# Patient Record
Sex: Female | Born: 1995 | Race: White | Hispanic: No | Marital: Single | Smoking: Never smoker
Health system: Southern US, Community
[De-identification: ages and names within clinical notes are randomized; demographics above are authoritative.]

---

## 2020-11-09 ENCOUNTER — Emergency Department: Payer: Self-pay

## 2020-11-09 ENCOUNTER — Encounter: Payer: Self-pay | Admitting: Emergency Medicine

## 2020-11-09 ENCOUNTER — Other Ambulatory Visit: Payer: Self-pay

## 2020-11-09 ENCOUNTER — Emergency Department
Admission: EM | Admit: 2020-11-09 | Discharge: 2020-11-09 | Disposition: A | Discharge status: Home / Self Care | Payer: Self-pay | Attending: Student in an Organized Health Care Education/Training Program | Admitting: Student in an Organized Health Care Education/Training Program

## 2020-11-09 DIAGNOSIS — N83201 Unspecified ovarian cyst, right side: Secondary | ICD-10-CM | POA: Insufficient documentation

## 2020-11-09 DIAGNOSIS — N760 Acute vaginitis: Secondary | ICD-10-CM | POA: Insufficient documentation

## 2020-11-09 DIAGNOSIS — B9689 Other specified bacterial agents as the cause of diseases classified elsewhere: Secondary | ICD-10-CM | POA: Insufficient documentation

## 2020-11-09 DIAGNOSIS — R310 Gross hematuria: Secondary | ICD-10-CM | POA: Insufficient documentation

## 2020-11-09 LAB — WET PREP, GENITAL
Sperm: NONE SEEN
Trich, Wet Prep: NONE SEEN
Yeast Wet Prep HPF POC: NONE SEEN

## 2020-11-09 LAB — CBC
HCT: 39.6 % (ref 36.0–46.0)
Hemoglobin: 12.5 g/dL (ref 12.0–15.0)
MCH: 24 pg — ABNORMAL LOW (ref 26.0–34.0)
MCHC: 31.6 g/dL (ref 30.0–36.0)
MCV: 76.2 fL — ABNORMAL LOW (ref 80.0–100.0)
Platelets: 322 10*3/uL (ref 150–400)
RBC: 5.2 MIL/uL — ABNORMAL HIGH (ref 3.87–5.11)
RDW: 17.7 % — ABNORMAL HIGH (ref 11.5–15.5)
WBC: 11.4 10*3/uL — ABNORMAL HIGH (ref 4.0–10.5)
nRBC: 0 % (ref 0.0–0.2)

## 2020-11-09 LAB — COMPREHENSIVE METABOLIC PANEL
ALT: 18 U/L (ref 0–44)
AST: 21 U/L (ref 15–41)
Albumin: 4 g/dL (ref 3.5–5.0)
Alkaline Phosphatase: 80 U/L (ref 38–126)
Anion gap: 6 (ref 5–15)
BUN: 13 mg/dL (ref 6–20)
CO2: 24 mmol/L (ref 22–32)
Calcium: 8.9 mg/dL (ref 8.9–10.3)
Chloride: 106 mmol/L (ref 98–111)
Creatinine, Ser: 0.39 mg/dL — ABNORMAL LOW (ref 0.44–1.00)
GFR, Estimated: >60 mL/min (ref >60)
Glucose, Bld: 94 mg/dL (ref 70–99)
Potassium: 4.2 mmol/L (ref 3.5–5.1)
Sodium: 136 mmol/L (ref 135–145)
Total Bilirubin: 0.6 mg/dL (ref 0.3–1.2)
Total Protein: 7.7 g/dL (ref 6.5–8.1)

## 2020-11-09 LAB — CHLAMYDIA/NGC RT PCR (ARMC ONLY)
Chlamydia Tr: NOT DETECTED
N gonorrhoeae: NOT DETECTED

## 2020-11-09 LAB — URINALYSIS, COMPLETE (UACMP) WITH MICROSCOPIC
Bilirubin Urine: NEGATIVE
Glucose, UA: NEGATIVE mg/dL
Ketones, ur: NEGATIVE mg/dL
Nitrite: NEGATIVE
Protein, ur: NEGATIVE mg/dL
RBC / HPF: >50 RBC/hpf — ABNORMAL HIGH (ref 0–5)
Specific Gravity, Urine: 1.017 (ref 1.005–1.030)
WBC, UA: >50 WBC/hpf — ABNORMAL HIGH (ref 0–5)
pH: 9 — ABNORMAL HIGH (ref 5.0–8.0)

## 2020-11-09 LAB — LIPASE, BLOOD: Lipase: 30 U/L (ref 11–51)

## 2020-11-09 LAB — PREGNANCY, URINE: Preg Test, Ur: NEGATIVE

## 2020-11-09 MED ORDER — METRONIDAZOLE 500 MG PO TABS
500.0000 mg | ORAL_TABLET | Freq: Once | ORAL | Status: AC
  Administered 2020-11-09: 500 mg via ORAL
  Filled 2020-11-09: qty 1

## 2020-11-09 MED ORDER — METRONIDAZOLE 500 MG PO TABS: 500.0000 mg | ORAL_TABLET | Freq: Two times a day (BID) | ORAL | 0 refills | Status: AC

## 2020-11-09 NOTE — ED Triage Notes (Signed)
Pt comes into the ED via POV c/o lower abdominal pain and blood in her urine.  PT states this has been ongoing x 2 weeks.  PT  concerned she may have a UTI.  Pt in NAD at this time.

## 2020-11-09 NOTE — ED Notes (Signed)
See triage note  Presents with urinary freq and pain to lower abd   With some nausea denies any vomiting

## 2020-11-09 NOTE — ED Provider Notes (Signed)
[facility] Emergency Department Provider Note  ____________________________________________   Event Date/Time   First MD Initiated Contact with Patient 11/09/20 1539     (approximate)  I have reviewed the triage vital signs and the nursing notes.   HISTORY  Chief Complaint Abdominal Pain  HPI Mallory Herrera is a 25 y.o. female presents to the ED with complaints of lower abdominal pain flank pain, and some gross hematuria.  She reports symptoms of the last 2 weeks.  Patient relocated to the area over the last 2 weeks, and has moved in with her boyfriend.  She also notes some vulvar pain with intercourse, over the last 2 weeks.  Patient denies any interim fever, chills, sweats, chest pain, shortness of breath patient denies a history of kidney stones, and denies any urinary retention.  History reviewed. No pertinent past medical history.  There are no problems to display for this patient.   History reviewed. No pertinent surgical history.  Prior to Admission medications   Medication Sig Start Date End Date Taking? Authorizing Provider  metroNIDAZOLE (FLAGYL) 500 MG tablet Take 1 tablet (500 mg total) by mouth 2 (two) times daily for 7 days. 11/09/20 11/16/20 Yes Coral Timme, Charlesetta Ivory, PA-C    Allergies Patient has no known allergies.  History reviewed. No pertinent family history.  Social History Social History   Tobacco Use   Smoking status: Never   Smokeless tobacco: Never  Substance Use Topics   Alcohol use: Not Currently   Drug use: Yes    Types: Marijuana    Review of Systems  Constitutional: No fever/chills Eyes: No visual changes. ENT: No sore throat. Cardiovascular: Denies chest pain. Respiratory: Denies shortness of breath. Gastrointestinal: Reports lower abdominal and flank pain.  No nausea, no vomiting.  No diarrhea.  No constipation. Genitourinary: Negative for dysuria.  Reports hematuria as above. Musculoskeletal: Negative  for back pain. Skin: Negative for rash. Neurological: Negative for headaches, focal weakness or numbness. ____________________________________________   PHYSICAL EXAM:  VITAL SIGNS: ED Triage Vitals [11/09/20 1300]  Enc Vitals Group     BP 129/90     Pulse Rate 86     Resp 18     Temp 98.3 F (36.8 C)     Temp Source Oral     SpO2 97 %     Weight 240 lb (108.9 kg)     Height 5\' 8"  (1.727 m)     Head Circumference      Peak Flow      Pain Score 6     Pain Loc      Pain Edu?      Excl. in GC?     Constitutional: Alert and oriented. Well appearing and in no acute distress. Head: Atraumatic. Cardiovascular: Normal rate, regular rhythm. Grossly normal heart sounds.  Good peripheral circulation. Respiratory: Normal respiratory effort.  No retractions. Lungs CTAB. Gastrointestinal: Soft and nontender. No distention. No abdominal bruits. Mild CVA tenderness. GU: Normal external genitalia.  Patient with some mild vulvar mucosal irritation to the right side of the introitus.  There is a copious amount of thick white discharge in the vault.  Patient without any significant CMT or adnexal masses appreciated.  Manual exam is however, uncomfortable for the patient secondary to the vaginal introitus. Musculoskeletal: No lower extremity tenderness nor edema.  No joint effusions. Neurologic:  Normal speech and language. No gross focal neurologic deficits are appreciated. No gait instability. Skin:  Skin is warm, dry and intact.  No rash noted. Psychiatric: Mood and affect are normal. Speech and behavior are normal. ____________________________________________   LABS (all labs ordered are listed, but only abnormal results are displayed)  Labs Reviewed  WET PREP, GENITAL - Abnormal; Notable for the following components:      Result Value   Clue Cells Wet Prep HPF POC PRESENT (*)    WBC, Wet Prep HPF POC MANY (*)    All other components within normal limits  COMPREHENSIVE METABOLIC PANEL  - Abnormal; Notable for the following components:   Creatinine, Ser 0.39 (*)    All other components within normal limits  CBC - Abnormal; Notable for the following components:   WBC 11.4 (*)    RBC 5.20 (*)    MCV 76.2 (*)    MCH 24.0 (*)    RDW 17.7 (*)    All other components within normal limits  URINALYSIS, COMPLETE (UACMP) WITH MICROSCOPIC - Abnormal; Notable for the following components:   Color, Urine STRAW (*)    APPearance HAZY (*)    pH 9.0 (*)    Hgb urine dipstick MODERATE (*)    Leukocytes,Ua SMALL (*)    RBC / HPF >50 (*)    WBC, UA >50 (*)    Bacteria, UA RARE (*)    All other components within normal limits  CHLAMYDIA/NGC RT PCR ([facility] ONLY)            URINE CULTURE  LIPASE, BLOOD  PREGNANCY, URINE   ____________________________________________  EKG   ____________________________________________  RADIOLOGY I, Lissa Hoard, personally viewed and evaluated these images (plain radiographs) as part of my medical decision making, as well as reviewing the written report by the radiologist.  ED MD interpretation:  agree with reports  Official radiology report(s): US Pelvis Complete  Result Date: 11/09/2020 CLINICAL DATA:  Lower abdominal pain for 10 days. EXAM: TRANSABDOMINAL AND TRANSVAGINAL ULTRASOUND OF PELVIS TECHNIQUE: Both transabdominal and transvaginal ultrasound examinations of the pelvis were performed. Transabdominal technique was performed for global imaging of the pelvis including uterus, ovaries, adnexal regions, and pelvic cul-de-sac. It was necessary to proceed with endovaginal exam following the transabdominal exam to visualize the uterus, endometrium and ovaries to better advantage. COMPARISON:  None FINDINGS: Uterus Measurements: 8.2 x 3.9 x 4.6 cm = volume: 77.8 mL. No fibroids or other mass visualized. Endometrium Thickness: 15 mm.  No focal abnormality visualized. Right ovary Measurements: 4.0 x 2.4 x 2.9 cm = volume: 14.6 mL. Irregular  complicated cyst arises from the ovary consistent with a corpus luteum or hemorrhagic cyst, 1.7 cm in greatest dimension. Ovary otherwise unremarkable. No adnexal masses. Left ovary Measurements: 3.2 x 1.6 x 2.8 cm = volume: 7.3 mL. Normal appearance/no adnexal mass. Other findings Small amount of pelvic free fluid IMPRESSION: 1. Irregular, 1.7 cm complicated right ovarian cyst, which could reflect a hemorrhagic cyst. There is also a small amount of pelvic free fluid. A ruptured ovarian cyst could potentially be the cause of this patient's lower abdominal pain. 2. Exam otherwise unremarkable. Electronically Signed   By: Amie Portland M.D.   On: 11/09/2020 19:30   CT Renal Stone Study  Result Date: 11/09/2020 CLINICAL DATA:  Flank pain, kidney stone suspected Hematuria, unknown cause EXAM: CT ABDOMEN AND PELVIS WITHOUT CONTRAST TECHNIQUE: Multidetector CT imaging of the abdomen and pelvis was performed following the standard protocol without IV contrast. COMPARISON:  None. FINDINGS: Evaluation is limited secondary to lack of IV contrast. Lower chest: No acute abnormality. Hepatobiliary: Unremarkable  noncontrast appearance of the liver and gallbladder. Pancreas: No peripancreatic fat stranding. Spleen: Upper limits of normal in size. Adrenals/Urinary Tract: Adrenal glands are unremarkable. No hydronephrosis. No nephrolithiasis. Bladder is completely decompressed, limiting evaluation. Stomach/Bowel: Stomach is within normal limits. Appendix appears normal. No evidence of bowel wall thickening, distention, or inflammatory changes. Vascular/Lymphatic: No significant vascular findings are present. No enlarged abdominal or pelvic lymph nodes. Reproductive: Uterus and bilateral adnexa are unremarkable. Other: No abdominal wall hernia or abnormality. No abdominopelvic ascites. Musculoskeletal: No acute or significant osseous findings. IMPRESSION: No CT etiology for hematuria identified. Electronically Signed   By:  Meda Klinefelter MD   On: 11/09/2020 18:04    ____________________________________________   PROCEDURES  Procedure(s) performed (including Critical Care):  Procedures  Metronidazole 500 mg PO ____________________________________________   INITIAL IMPRESSION / ASSESSMENT AND PLAN / ED COURSE  As part of my medical decision making, I reviewed the following data within the electronic MEDICAL RECORD NUMBER Labs reviewed as above, Radiograph reviewed as noted, and Notes from prior ED visits    Differential diagnosis includes, but is not limited to, ovarian cyst, ovarian torsion, acute appendicitis, diverticulitis, urinary tract infection/pyelonephritis, endometriosis, bowel obstruction, colitis, renal colic, gastroenteritis, hernia, fibroids, endometriosis, pregnancy related pain including ectopic pregnancy, etc.  Female patient ED evaluation of gross hematuria as well as some flank pain and some lower abdominal pain.  Patient was evaluated for her complaints in the ED with labs which showed a mild bump in her white count.  Her urinalysis did not indicate an acute bacteriuria but did have some significant RBCs and right blood and WBCs.  Patient was subsequently sent for a renal stone study, which was negative without any indication for the patient's gross hematuria.  Her subsequent pelvic ultrasound did confirm a right ovarian cyst concerning for hemorrhagic cyst.  She also had evidence of a likely recently ruptured ovarian cyst.  Otherwise the patient's wet prep confirmed clue cells, and her GC culture is negative at this time.  Patient be treated empirically for her BV with metronidazole.  She is encouraged to follow-up with a local health department or GYN provider for ongoing management.  Return precautions have been reviewed. ____________________________________________   FINAL CLINICAL IMPRESSION(S) / ED DIAGNOSES  Final diagnoses:  BV (bacterial vaginosis)  Cyst of right ovary     ED  Discharge Orders          Ordered    metroNIDAZOLE (FLAGYL) 500 MG tablet  2 times daily        11/09/20 1959             Note:  This document was prepared using Dragon voice recognition software and may include unintentional dictation errors.    Lissa Hoard, PA-C 11/09/20 2014    Willy Eddy, MD 11/11/20 [phone]

## 2020-11-09 NOTE — Discharge Instructions (Addendum)
Your exam, labs, CT, ultrasound are all reassuring as they are showing no signs of any acute or serious injuries or infections.  You do have an ovarian cyst which is likely causing your pelvic pain.  Your urine culture is pending at this time, and will result tomorrow.  If treatment is necessary you will be notified via phone.  Follow-up with the [facility] department or [facility] for ongoing symptoms.  Return to the ED if needed.

## 2020-11-10 LAB — POC URINE PREG, ED: Preg Test, Ur: NEGATIVE

## 2020-11-12 LAB — URINE CULTURE
Culture: >=100000 — AB
Special Requests: NORMAL

## 2020-12-05 ENCOUNTER — Encounter: Payer: Self-pay | Admitting: *Deleted

## 2020-12-05 ENCOUNTER — Other Ambulatory Visit: Payer: Self-pay

## 2020-12-05 ENCOUNTER — Emergency Department
Admission: EM | Admit: 2020-12-05 | Discharge: 2020-12-05 | Disposition: A | Discharge status: Home / Self Care | Payer: Self-pay | Attending: Emergency Medicine | Admitting: Emergency Medicine

## 2020-12-05 ENCOUNTER — Emergency Department: Payer: Self-pay

## 2020-12-05 DIAGNOSIS — N739 Female pelvic inflammatory disease, unspecified: Secondary | ICD-10-CM | POA: Insufficient documentation

## 2020-12-05 DIAGNOSIS — N939 Abnormal uterine and vaginal bleeding, unspecified: Secondary | ICD-10-CM

## 2020-12-05 DIAGNOSIS — R52 Pain, unspecified: Secondary | ICD-10-CM

## 2020-12-05 DIAGNOSIS — R1030 Lower abdominal pain, unspecified: Secondary | ICD-10-CM

## 2020-12-05 LAB — COMPREHENSIVE METABOLIC PANEL
ALT: 19 U/L (ref 0–44)
AST: 21 U/L (ref 15–41)
Albumin: 3.9 g/dL (ref 3.5–5.0)
Alkaline Phosphatase: 97 U/L (ref 38–126)
Anion gap: 5 (ref 5–15)
BUN: 15 mg/dL (ref 6–20)
CO2: 25 mmol/L (ref 22–32)
Calcium: 8.7 mg/dL — ABNORMAL LOW (ref 8.9–10.3)
Chloride: 107 mmol/L (ref 98–111)
Creatinine, Ser: 0.47 mg/dL (ref 0.44–1.00)
GFR, Estimated: >60 mL/min (ref >60)
Glucose, Bld: 94 mg/dL (ref 70–99)
Potassium: 4.2 mmol/L (ref 3.5–5.1)
Sodium: 137 mmol/L (ref 135–145)
Total Bilirubin: 0.4 mg/dL (ref 0.3–1.2)
Total Protein: 7.7 g/dL (ref 6.5–8.1)

## 2020-12-05 LAB — URINALYSIS, COMPLETE (UACMP) WITH MICROSCOPIC
Bacteria, UA: NONE SEEN
Bilirubin Urine: NEGATIVE
Glucose, UA: NEGATIVE mg/dL
Ketones, ur: NEGATIVE mg/dL
Leukocytes,Ua: NEGATIVE
Nitrite: NEGATIVE
Protein, ur: NEGATIVE mg/dL
Specific Gravity, Urine: 1.029 (ref 1.005–1.030)
pH: 7 (ref 5.0–8.0)

## 2020-12-05 LAB — CBC
HCT: 38.3 % (ref 36.0–46.0)
Hemoglobin: 12.2 g/dL (ref 12.0–15.0)
MCH: 25.2 pg — ABNORMAL LOW (ref 26.0–34.0)
MCHC: 31.9 g/dL (ref 30.0–36.0)
MCV: 79.1 fL — ABNORMAL LOW (ref 80.0–100.0)
Platelets: 305 10*3/uL (ref 150–400)
RBC: 4.84 MIL/uL (ref 3.87–5.11)
RDW: 18.2 % — ABNORMAL HIGH (ref 11.5–15.5)
WBC: 11.1 10*3/uL — ABNORMAL HIGH (ref 4.0–10.5)
nRBC: 0 % (ref 0.0–0.2)

## 2020-12-05 LAB — CHLAMYDIA/NGC RT PCR (ARMC ONLY)
Chlamydia Tr: NOT DETECTED
N gonorrhoeae: NOT DETECTED

## 2020-12-05 LAB — WET PREP, GENITAL
Clue Cells Wet Prep HPF POC: NONE SEEN
Sperm: NONE SEEN
Trich, Wet Prep: NONE SEEN
Yeast Wet Prep HPF POC: NONE SEEN

## 2020-12-05 LAB — LIPASE, BLOOD: Lipase: 32 U/L (ref 11–51)

## 2020-12-05 LAB — POC URINE PREG, ED: Preg Test, Ur: NEGATIVE

## 2020-12-05 MED ORDER — CEFTRIAXONE SODIUM 1 G IJ SOLR
500.0000 mg | Freq: Once | INTRAMUSCULAR | Status: AC
  Administered 2020-12-05: 500 mg via INTRAMUSCULAR
  Filled 2020-12-05: qty 10

## 2020-12-05 MED ORDER — DOXYCYCLINE HYCLATE 100 MG PO TABS: 100.0000 mg | ORAL_TABLET | Freq: Two times a day (BID) | ORAL | 0 refills | Status: DC

## 2020-12-05 MED ORDER — DOXYCYCLINE HYCLATE 100 MG PO TABS
100.0000 mg | ORAL_TABLET | Freq: Once | ORAL | Status: AC
  Administered 2020-12-05: 100 mg via ORAL
  Filled 2020-12-05: qty 1

## 2020-12-05 NOTE — ED Triage Notes (Signed)
Pt reports onset of generalized abdominal pain, vaginal bleeding "bright pink/reddish" tonight, lower back pain, LMP 11/20/20-reports as normal. Pain in the pelvic area and bleeding after intercourse tonight. Denies painful urination.

## 2020-12-05 NOTE — ED Provider Notes (Signed)
[facility] Emergency Department Provider Note   ____________________________________________    I have reviewed the triage vital signs and the nursing notes.   HISTORY  Chief Complaint Abdominal Pain     HPI Mallory Herrera is a 25 y.o. female who presents with complaints of pelvic pain.  Patient reports over the last month in particular she has had pain with intercourse.  Last night it was worse than typical.  She reports some discharge that was bloody afterwards.  No nausea or vomiting.  No fever.  She does not have any concerns about STDs.  History reviewed. No pertinent past medical history.  There are no problems to display for this patient.   History reviewed. No pertinent surgical history.  Prior to Admission medications   Medication Sig Start Date End Date Taking? Authorizing Provider  doxycycline (VIBRA-TABS) 100 MG tablet Take 1 tablet (100 mg total) by mouth 2 (two) times daily. 12/05/20  Yes Jene Every, MD     Allergies Patient has no known allergies.  No family history on file.  Social History Social History   Tobacco Use   Smoking status: Never   Smokeless tobacco: Never  Substance Use Topics   Alcohol use: Not Currently   Drug use: Yes    Types: Marijuana    Review of Systems  Constitutional: No fever/chills Eyes: No visual changes.  ENT: No sore throat. Cardiovascular: Denies chest pain. Respiratory: Denies shortness of breath. Gastrointestinal: As above Genitourinary: As above Musculoskeletal: Negative for back pain. Skin: Negative for rash. Neurological: Negative for headaches or weakness   ____________________________________________   PHYSICAL EXAM:  VITAL SIGNS: ED Triage Vitals  Enc Vitals Group     BP 12/05/20 0159 116/88     Pulse Rate 12/05/20 0159 85     Resp 12/05/20 0159 16     Temp 12/05/20 0159 99.2 F (37.3 C)     Temp Source 12/05/20 0159 Oral     SpO2 12/05/20 0159 94 %      Weight --      Height --      Head Circumference --      Peak Flow --      Pain Score 12/05/20 0159 7     Pain Loc --      Pain Edu? --      Excl. in GC? --     Constitutional: Alert and oriented. Eyes: Conjunctivae are normal.   Nose: No congestion/rhinnorhea. Mouth/Throat: Mucous membranes are moist.    Cardiovascular: Normal rate, regular rhythm. Grossly normal heart sounds.  Good peripheral circulation. Respiratory: Normal respiratory effort.  No retractions. Lungs CTAB. Gastrointestinal: Soft and nontender. No distention.   Genitourinary: Significant inflammation of the vaginal walls causing pain with speculum opening limiting exam, discharge from the cervix dark in color, no foreign bodies noted Musculoskeletal: No lower extremity tenderness nor edema.   Neurologic:  Normal speech and language. No gross focal neurologic deficits are appreciated.  Skin:  Skin is warm, dry and intact. Psychiatric: Mood and affect are normal. Speech and behavior are normal.  ____________________________________________   LABS (all labs ordered are listed, but only abnormal results are displayed)  Labs Reviewed  WET PREP, GENITAL - Abnormal; Notable for the following components:      Result Value   WBC, Wet Prep HPF POC MANY (*)    All other components within normal limits  COMPREHENSIVE METABOLIC PANEL - Abnormal; Notable for the following components:   Calcium 8.7 (*)  All other components within normal limits  CBC - Abnormal; Notable for the following components:   WBC 11.1 (*)    MCV 79.1 (*)    MCH 25.2 (*)    RDW 18.2 (*)    All other components within normal limits  URINALYSIS, COMPLETE (UACMP) WITH MICROSCOPIC - Abnormal; Notable for the following components:   Color, Urine YELLOW (*)    APPearance CLEAR (*)    Hgb urine dipstick MODERATE (*)    All other components within normal limits  CHLAMYDIA/NGC RT PCR ([facility] ONLY)            LIPASE, BLOOD  POC URINE PREG, ED    ____________________________________________  EKG  None ____________________________________________  RADIOLOGY  Pelvic ultrasound unremarkable ____________________________________________   PROCEDURES  Procedure(s) performed: No  Procedures   Critical Care performed: No ____________________________________________   INITIAL IMPRESSION / ASSESSMENT AND PLAN / ED COURSE  Pertinent labs & imaging results that were available during my care of the patient were reviewed by me and considered in my medical decision making (see chart for details).   Patient presents with painful vaginal intercourse, some discharge after intercourse last night.  Lab work ultrasound is reassuring, urinalysis demonstrates some hemoglobin no evidence of UTI.  Pregnancy test is negative.  GU exam demonstrates discharge concerning for possible infection/PID.  Wet prep demonstrates large white blood cells, will will give IM Rocephin and treat with doxycycline for possible PID/cervicitis.  Close GYN follow-up recommended      ____________________________________________   FINAL CLINICAL IMPRESSION(S) / ED DIAGNOSES  Final diagnoses:  Pain  Pelvic inflammatory disease        Note:  This document was prepared using Dragon voice recognition software and may include unintentional dictation errors.    Jene Every, MD 12/05/20 [phone]

## 2020-12-15 ENCOUNTER — Emergency Department
Admission: EM | Admit: 2020-12-15 | Discharge: 2020-12-15 | Disposition: A | Discharge status: Home / Self Care | Payer: Self-pay | Attending: Emergency Medicine | Admitting: Emergency Medicine

## 2020-12-15 ENCOUNTER — Other Ambulatory Visit: Payer: Self-pay

## 2020-12-15 DIAGNOSIS — N898 Other specified noninflammatory disorders of vagina: Secondary | ICD-10-CM | POA: Insufficient documentation

## 2020-12-15 DIAGNOSIS — R102 Pelvic and perineal pain: Secondary | ICD-10-CM | POA: Insufficient documentation

## 2020-12-15 LAB — URINALYSIS, COMPLETE (UACMP) WITH MICROSCOPIC
Bilirubin Urine: NEGATIVE
Glucose, UA: NEGATIVE mg/dL
Ketones, ur: NEGATIVE mg/dL
Nitrite: NEGATIVE
Protein, ur: NEGATIVE mg/dL
Specific Gravity, Urine: 1.02 (ref 1.005–1.030)
pH: 5 (ref 5.0–8.0)

## 2020-12-15 LAB — WET PREP, GENITAL
Clue Cells Wet Prep HPF POC: NONE SEEN
Sperm: NONE SEEN
Trich, Wet Prep: NONE SEEN
Yeast Wet Prep HPF POC: NONE SEEN

## 2020-12-15 LAB — POC URINE PREG, ED: Preg Test, Ur: NEGATIVE

## 2020-12-15 LAB — CHLAMYDIA/NGC RT PCR (ARMC ONLY)
Chlamydia Tr: NOT DETECTED
N gonorrhoeae: NOT DETECTED

## 2020-12-15 NOTE — Discharge Instructions (Addendum)
Please follow-up with your regularly scheduled appointment with your OB/GYN on 01/04/2021

## 2020-12-15 NOTE — ED Provider Notes (Signed)
[facility] Emergency Department Provider Note   ____________________________________________   Event Date/Time   First MD Initiated Contact with Patient 12/15/20 1033     (approximate)  I have reviewed the triage vital signs and the nursing notes.   HISTORY  Chief Complaint Vaginal Discharge    HPI Mallory Herrera is a 25 y.o. female who presents for abnormal vaginal discharge  LOCATION: Vagina DURATION: 3 days TIMING: Stable since onset SEVERITY: Mild QUALITY: Whitish-yellowish discharge CONTEXT: Patient states she was recently diagnosed with pelvic inflammatory disease and placed on antibiotics including IM shot of Rocephin and 5 days of doxycycline.  Patient states that after stopping the antibiotic she began noticing a yellowish tint to discharge from her vagina that had no smell and was not associated with any pelvic pain MODIFYING FACTORS: Denies any exacerbating or relieving factors ASSOCIATED SYMPTOMS: Intermittent pain with intercourse   Per medical record review, patiently recent diagnosed with pelvic inflammatory disease on 12/05/2020 and given a an IM shot of Rocephin as well as discharged on 5 days of doxycycline          History reviewed. No pertinent past medical history.  There are no problems to display for this patient.   History reviewed. No pertinent surgical history.  Prior to Admission medications   Not on File    Allergies Patient has no known allergies.  No family history on file.  Social History Social History   Tobacco Use   Smoking status: Never   Smokeless tobacco: Never  Substance Use Topics   Alcohol use: Not Currently   Drug use: Yes    Types: Marijuana    Review of Systems Constitutional: No fever/chills Eyes: No visual changes. ENT: No sore throat. Cardiovascular: Denies chest pain. Respiratory: Denies shortness of breath. Gastrointestinal: No abdominal pain.  No nausea, no vomiting.  No  diarrhea. Genitourinary: Negative for dysuria.  Positive for abnormal vaginal discharge Musculoskeletal: Negative for acute arthralgias Skin: Negative for rash. Neurological: Negative for headaches, weakness/numbness/paresthesias in any extremity Psychiatric: Negative for suicidal ideation/homicidal ideation   ____________________________________________   PHYSICAL EXAM:  VITAL SIGNS: ED Triage Vitals  Enc Vitals Group     BP 12/15/20 1016 134/69     Pulse Rate 12/15/20 1016 75     Resp 12/15/20 1014 16     Temp 12/15/20 1014 98.3 F (36.8 C)     Temp Source 12/15/20 1014 Oral     SpO2 12/15/20 1014 100 %     Weight 12/15/20 1014 240 lb (108.9 kg)     Height 12/15/20 1014 5\' 8"  (1.727 m)     Head Circumference --      Peak Flow --      Pain Score 12/15/20 1014 0     Pain Loc --      Pain Edu? --      Excl. in GC? --    Constitutional: Alert and oriented. Well appearing and in no acute distress. Eyes: Conjunctivae are normal. PERRL. Head: Atraumatic. Nose: No congestion/rhinnorhea. Mouth/Throat: Mucous membranes are moist. Neck: No stridor Cardiovascular: Grossly normal heart sounds.  Good peripheral circulation. Respiratory: Normal respiratory effort.  No retractions. Gastrointestinal: Soft and nontender. No distention. Pelvic: Normal external female genitalia without lesions or rash.  Vaginal canal without rash or discoloration.  Some ecchymosis to the cervix at the 7 o'clock position.  There is normal physiologic discharge without any abnormal smell Musculoskeletal: No obvious deformities Neurologic:  Normal speech and language. No gross  focal neurologic deficits are appreciated. Skin:  Skin is warm and dry. No rash noted. Psychiatric: Mood and affect are normal. Speech and behavior are normal.  ____________________________________________   LABS (all labs ordered are listed, but only abnormal results are displayed)  Labs Reviewed  URINALYSIS, COMPLETE (UACMP)  WITH MICROSCOPIC - Abnormal; Notable for the following components:      Result Value   Color, Urine YELLOW (*)    APPearance CLEAR (*)    Hgb urine dipstick SMALL (*)    Leukocytes,Ua TRACE (*)    Bacteria, UA RARE (*)    All other components within normal limits  CHLAMYDIA/NGC RT PCR ([facility] ONLY)            WET PREP, GENITAL  POC URINE PREG, ED   PROCEDURES  Procedure(s) performed (including Critical Care):  Procedures   ____________________________________________   INITIAL IMPRESSION / ASSESSMENT AND PLAN / ED COURSE  As part of my medical decision making, I reviewed the following data within the electronic medical record, if available:  Nursing notes reviewed and incorporated, Labs reviewed, EKG interpreted, Old chart reviewed, Radiograph reviewed and Notes from prior ED visits reviewed and incorporated        3 days of abnormal vaginal discharge most likely of nonemergent etiology.  Workup: CBC, BMP, UA, bHCG, Type&Screen  Based on History, Exam, and Workup I believe the patient's presentation not consistent with pregnancy, trauma, serious bacterial infection, central process or other emergency.  Patient Stable Appearing and presentation most likely normal physiologic discharge or other non-emergent cause of abnormal vaginal discharge.  Disposition: Will discharge home with return precautions and instruction for prompt OBGYN follow up.      ____________________________________________   FINAL CLINICAL IMPRESSION(S) / ED DIAGNOSES  Final diagnoses:  Vaginal discharge     ED Discharge Orders     None        Note:  This document was prepared using Dragon voice recognition software and may include unintentional dictation errors.    Merwyn Katos, MD 12/15/20 [phone]

## 2020-12-15 NOTE — ED Triage Notes (Signed)
Pt to ED for vaginal discharge. Reports light yellow discharge.  Reports on antibiotic, recently discharged with PID

## 2020-12-15 NOTE — ED Notes (Addendum)
See triage note  Presents with vaginal discharge   States she has been seen twice for same  dx'd with BV and then PID  Was placed on meds  But still having sx's having painful intercourse

## 2021-02-16 ENCOUNTER — Emergency Department
Admission: EM | Admit: 2021-02-16 | Discharge: 2021-02-16 | Disposition: A | Discharge status: Home / Self Care | Payer: Medicaid - Out of State | Attending: Emergency Medicine | Admitting: Emergency Medicine

## 2021-02-16 ENCOUNTER — Emergency Department: Payer: Medicaid - Out of State

## 2021-02-16 DIAGNOSIS — O209 Hemorrhage in early pregnancy, unspecified: Secondary | ICD-10-CM | POA: Diagnosis present

## 2021-02-16 DIAGNOSIS — Z3A08 8 weeks gestation of pregnancy: Secondary | ICD-10-CM | POA: Insufficient documentation

## 2021-02-16 DIAGNOSIS — O4690 Antepartum hemorrhage, unspecified, unspecified trimester: Secondary | ICD-10-CM

## 2021-02-16 LAB — URINALYSIS, ROUTINE W REFLEX MICROSCOPIC
Bilirubin Urine: NEGATIVE
Glucose, UA: NEGATIVE mg/dL
Ketones, ur: NEGATIVE mg/dL
Nitrite: NEGATIVE
Protein, ur: NEGATIVE mg/dL
Specific Gravity, Urine: 1.027 (ref 1.005–1.030)
pH: 5 (ref 5.0–8.0)

## 2021-02-16 LAB — POC URINE PREG, ED: Preg Test, Ur: POSITIVE — AB

## 2021-02-16 LAB — HCG, QUANTITATIVE, PREGNANCY: hCG, Beta Chain, Quant, S: 145952 m[IU]/mL — ABNORMAL HIGH (ref <5)

## 2021-02-16 LAB — ABO/RH: ABO/RH(D): O POS

## 2021-02-16 NOTE — ED Triage Notes (Signed)
Patient arrives via POV with complaint of vaginal bleeding after intercourse with boyfriend. Patient states she is pregnant but has not seen an OBGYN and does not know how far along she is. Patient states small amount of bright red blood, denies any clots. Patient endorses burning with urination and urgency to go. Patient endorses nausea, last episode of vomiting was three days ago. Patient is ambulatory, AxOx4, in NAD.

## 2021-02-16 NOTE — ED Notes (Signed)
Pt reports bleeding after sex this am at 0630, pt states that she is also pregnant and is uncertain how far a long she is, states that she was supposed to go to  today to get her paperwork done for medicaid for the pregnancy, states that she has been having discomfort with intercourse for awhile

## 2021-02-16 NOTE — ED Provider Notes (Signed)
[facility] Emergency Department Provider Note  ____________________________________________  Time seen: Approximately 10:33 AM  I have reviewed the triage vital signs and the nursing notes.   HISTORY  Chief Complaint Vaginal Bleeding    HPI Mallory Herrera is a 25 y.o. female who presents to the emergency department for treatment and evaluation after having 1 episode of vaginal bleeding after intercourse this morning.  She states that she is pregnant but has not had her initial OB visit.  Last menstrual cycle was 11/20/2020. No pelvic/abdominal pain. Has not had vaginal discharge and not concerned for STD. No fever.   No past medical history on file.  There are no problems to display for this patient.   No past surgical history on file.  Prior to Admission medications   Not on File    Allergies Patient has no known allergies.  No family history on file.  Social History Social History   Tobacco Use   Smoking status: Never   Smokeless tobacco: Never  Substance Use Topics   Alcohol use: Not Currently   Drug use: Yes    Types: Marijuana    Review of Systems Constitutional: Negative for fever. Respiratory: Negative for shortness of breath or cough. Gastrointestinal: Negative for abdominal pain; negative for nausea , negative for vomiting. Genitourinary: Negative for dysuria , negative for vaginal discharge.  Positive for vaginal bleeding Musculoskeletal: Negative for back pain. Skin: Negative for acute skin changes/rash/lesion. ____________________________________________   PHYSICAL EXAM:  VITAL SIGNS: ED Triage Vitals [02/16/21 0650]  Enc Vitals Group     BP (!) 126/56     Pulse Rate 80     Resp 16     Temp 98.3 F (36.8 C)     Temp Source Oral     SpO2 97 %     Weight 240 lb 1.3 oz (108.9 kg)     Height 5\' 8"  (1.727 m)     Head Circumference      Peak Flow      Pain Score 0     Pain Loc      Pain Edu?      Excl. in GC?      Constitutional: Alert and oriented. Well appearing and in no acute distress. Eyes: Conjunctivae are normal. Head: Atraumatic. Nose: No congestion/rhinnorhea. Mouth/Throat: Mucous membranes are moist. Respiratory: Normal respiratory effort.  No retractions. Gastrointestinal: Bowel sounds active x 4; Abdomen is soft without rebound or guarding. Genitourinary: Pelvic exam: Deferred Musculoskeletal: No extremity tenderness nor edema.  Neurologic:  Normal speech and language. No gross focal neurologic deficits are appreciated. Speech is normal. No gait instability. Skin:  Skin is warm, dry and intact. No rash noted on exposed skin. Psychiatric: Mood and affect are normal. Speech and behavior are normal.  ____________________________________________   LABS (all labs ordered are listed, but only abnormal results are displayed)  Labs Reviewed  HCG, QUANTITATIVE, PREGNANCY - Abnormal; Notable for the following components:      Result Value   hCG, Beta Chain, Quant, S (*)    All other components within normal limits  URINALYSIS, ROUTINE W REFLEX MICROSCOPIC - Abnormal; Notable for the following components:   Color, Urine YELLOW (*)    APPearance HAZY (*)    Hgb urine dipstick LARGE (*)    Leukocytes,Ua SMALL (*)    Bacteria, UA RARE (*)    All other components within normal limits  POC URINE PREG, ED - Abnormal; Notable for the following components:  Preg Test, Ur POSITIVE (*)    All other components within normal limits  URINE CULTURE  ABO/RH   ____________________________________________  RADIOLOGY  Ultrasound shows single IUP 8 weeks 4 days with a fetal heart rate of 160. ____________________________________________  Procedures  ____________________________________________  25 year old female presenting to the emergency department for treatment and evaluation of vaginal bleeding after intercourse today.  See HPI for further details.  Patient denies active  vaginal bleeding, vaginal discharge, or dysuria.  Ultrasound is reassuring.  Single IUP with fetal heart rate of 160 at 8 weeks 4 days.  Results discussed with the patient.   Patient to be discharged home with instructions to call and schedule follow-up appointment with OB/GYN.  She states that she lives in [place] and her care will be there after her Medicaid is approved.  She will be given follow-up information for on-call OB/GYN here in case she decides otherwise.  She was advised to return to the emergency department if she experiences abdominal pain, pelvic pain, heavy vaginal bleeding, or other symptoms of concern.  Pertinent labs & imaging results that were available during my care of the patient were reviewed by me and considered in my medical decision making (see chart for details).  ____________________________________________   FINAL CLINICAL IMPRESSION(S) / ED DIAGNOSES  Final diagnoses:  Vaginal bleeding in pregnancy  First trimester bleeding    Note:  This document was prepared using Dragon voice recognition software and may include unintentional dictation errors.   Chinita Pester, FNP 02/16/21 1042    Minna Antis, MD 02/16/21 1502

## 2021-02-17 LAB — URINE CULTURE: Culture: <10000 — AB

## 2022-04-20 IMAGING — US US PELVIS COMPLETE
1 series · 13 of 25 positions shown · non-contrast
Comparison: None

CLINICAL DATA: Lower abdominal pain for 10 days.

EXAM:
TRANSABDOMINAL AND TRANSVAGINAL ULTRASOUND OF PELVIS
TECHNIQUE: Both transabdominal and transvaginal ultrasound examinations of the
pelvis were performed. Transabdominal technique was performed for
global imaging of the pelvis including uterus, ovaries, adnexal
regions, and pelvic cul-de-sac. It was necessary to proceed with
endovaginal exam following the transabdominal exam to visualize the
uterus, endometrium and ovaries to better advantage.

[Series 1: us pelvis (transabdominal only) · 13 of 114 slices shown]
[im 1/114]
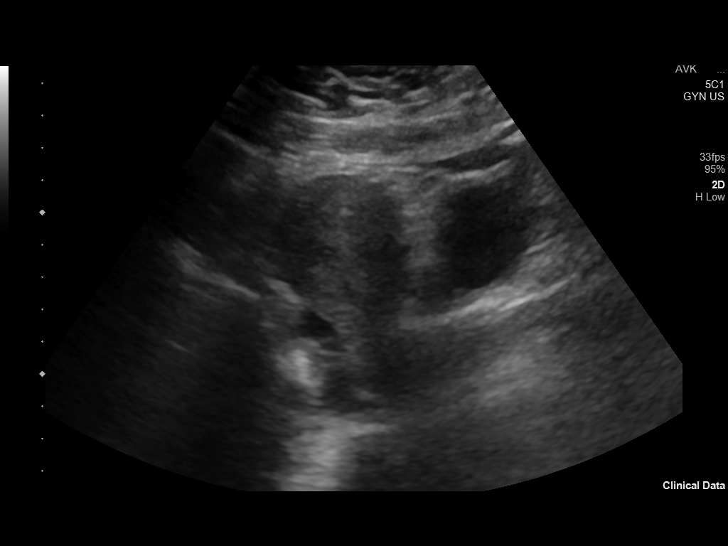
[im 10/114]
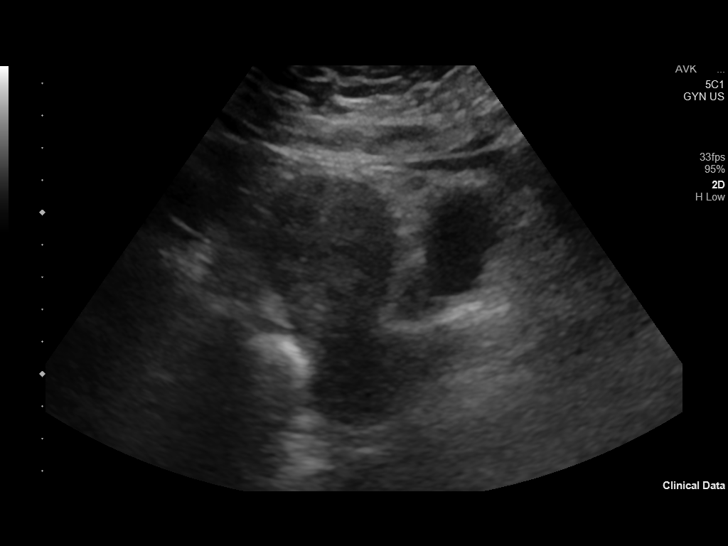
[im 19/114]
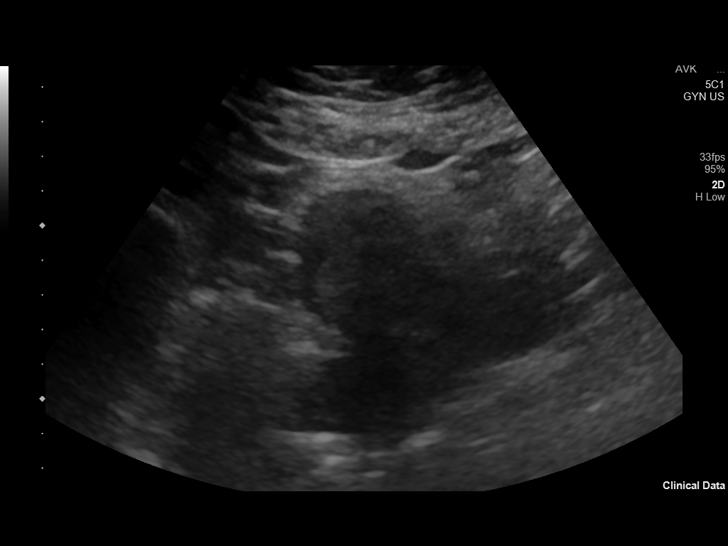
[im 29/114]
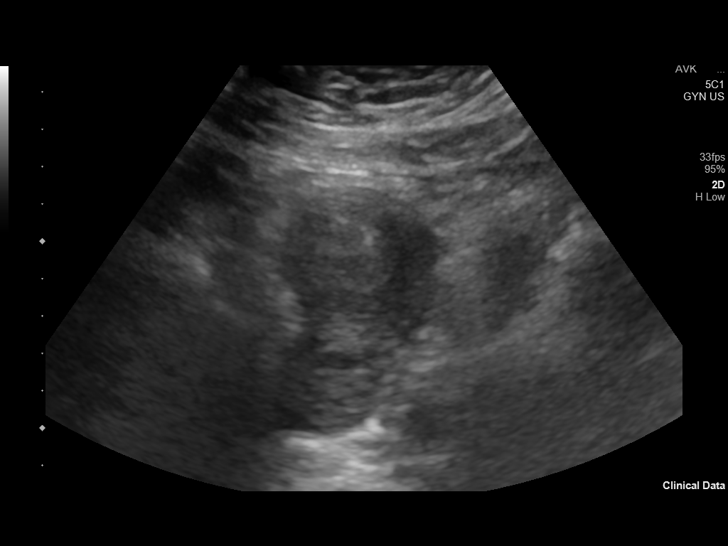
[im 38/114]
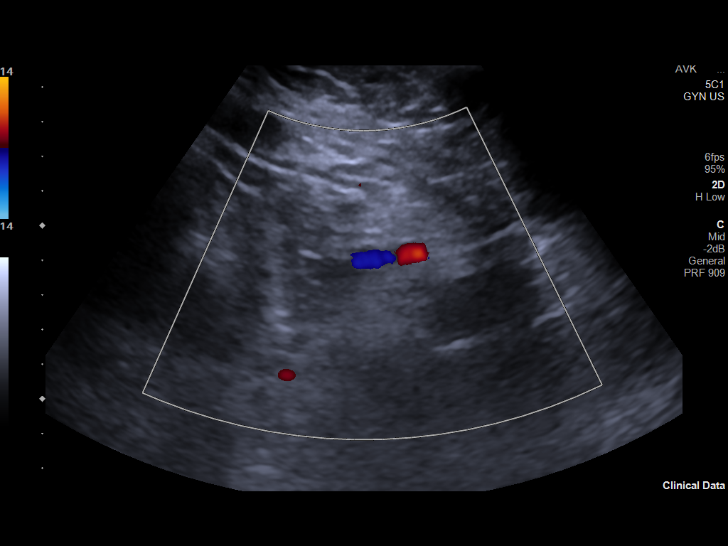
[im 48/114]
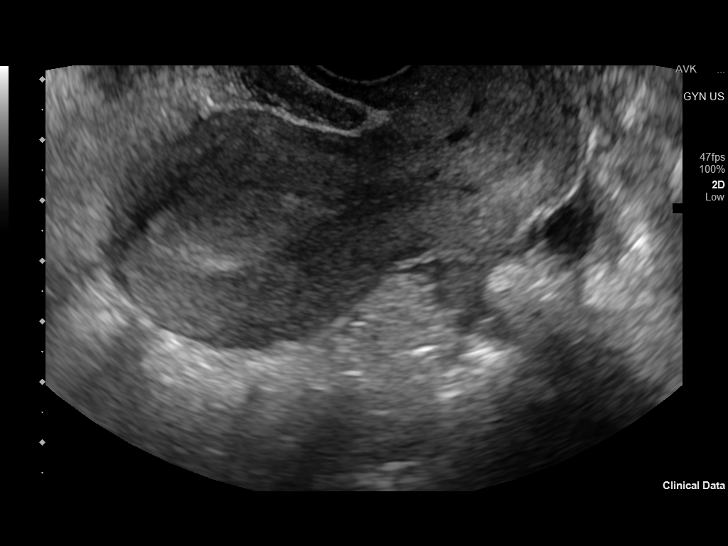
[im 57/114]
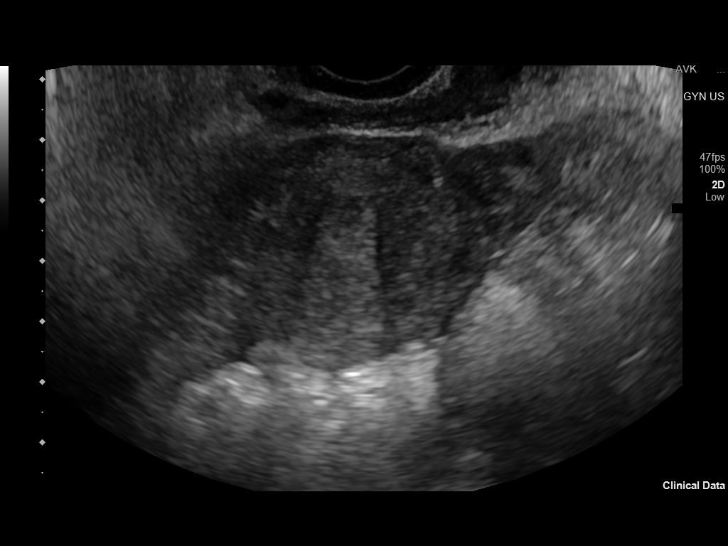
[im 66/114]
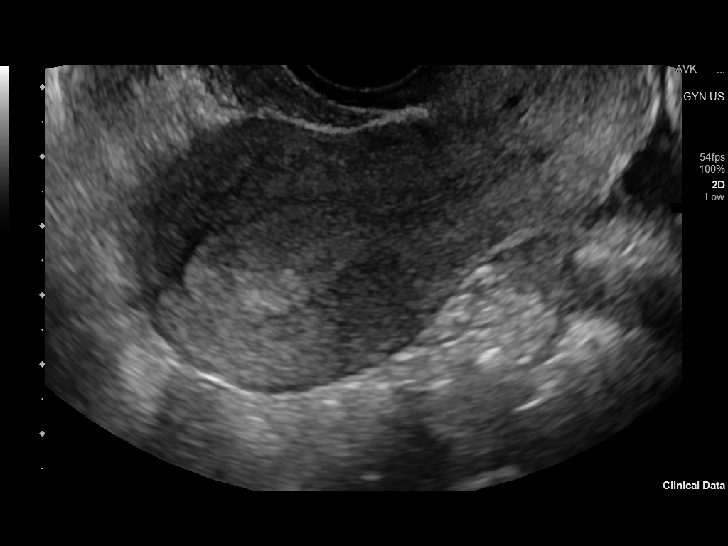
[im 76/114]
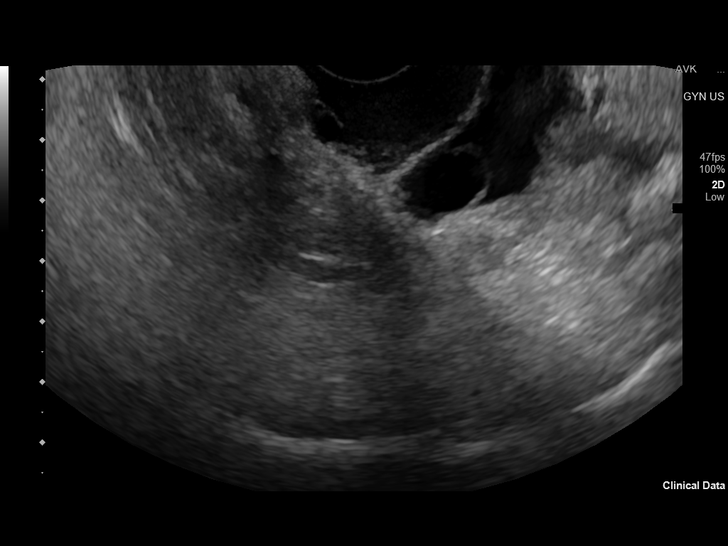
[im 85/114]
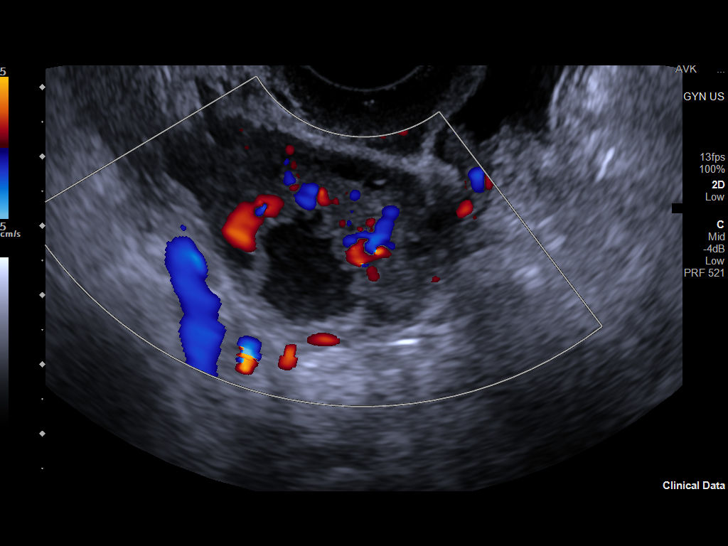
[im 95/114]
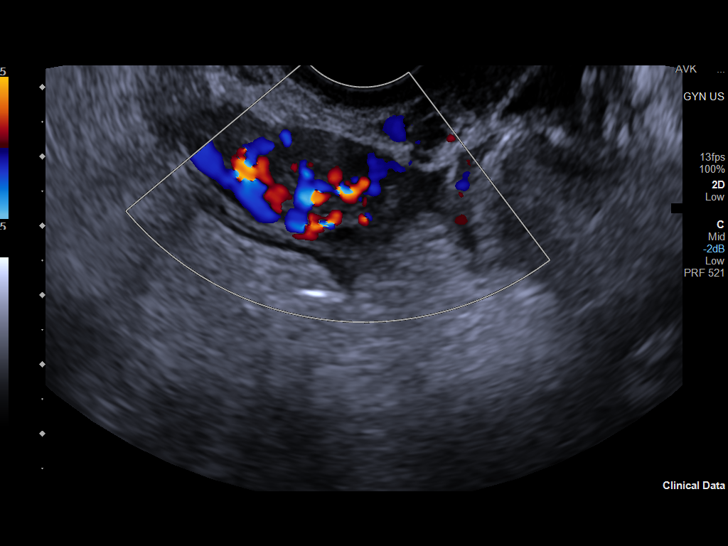
[im 104/114]
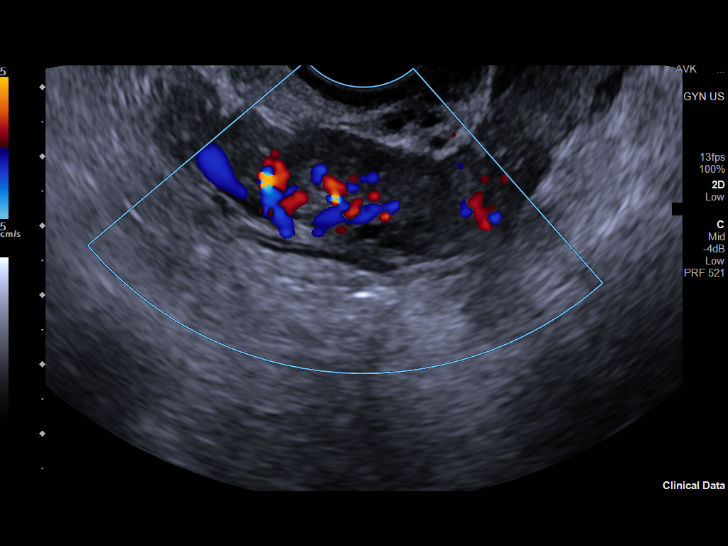
[im 114/114]
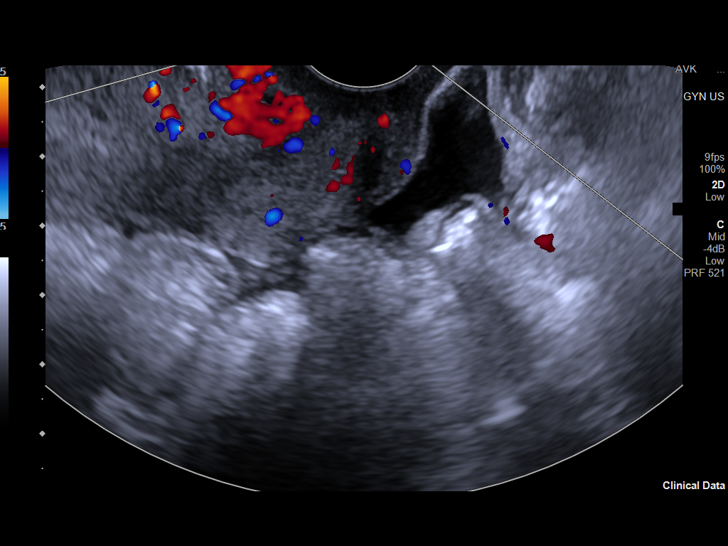

[13 of 25 positions shown; findings below may reference images not displayed]

FINDINGS: Uterus

Measurements: 8.2 x 3.9 x 4.6 cm = volume: 77.8 mL. No fibroids or
other mass visualized.

Endometrium

Thickness: 15 mm.  No focal abnormality visualized.

Right ovary

Measurements: 4.0 x 2.4 x 2.9 cm = volume: 14.6 mL. Irregular
complicated cyst arises from the ovary consistent with a corpus
luteum or hemorrhagic cyst, 1.7 cm in greatest dimension. Ovary
otherwise unremarkable. No adnexal masses.

Left ovary

Measurements: 3.2 x 1.6 x 2.8 cm = volume: 7.3 mL. Normal
appearance/no adnexal mass.

Other findings

Small amount of pelvic free fluid
IMPRESSION: 1. Irregular, 1.7 cm complicated right ovarian cyst, which could
reflect a hemorrhagic cyst. There is also a small amount of pelvic
free fluid. A ruptured ovarian cyst could potentially be the cause
of this patient's lower abdominal pain.
2. Exam otherwise unremarkable.

## 2022-04-20 IMAGING — CT CT RENAL STONE PROTOCOL
2 of 4 series · 16 of 46 positions shown, 18 images · non-contrast
Comparison: None.

CLINICAL DATA: Flank pain, kidney stone suspected Hematuria,
unknown cause

EXAM:
CT ABDOMEN AND PELVIS WITHOUT CONTRAST
TECHNIQUE: Multidetector CT imaging of the abdomen and pelvis was performed
following the standard protocol without IV contrast.

[Series 2: stone full standard · axial · 0.74mm/px · z∈[-1014,-529]mm · 13 of 107 slices shown, 15 images]
[im 5/107  soft-tissue]
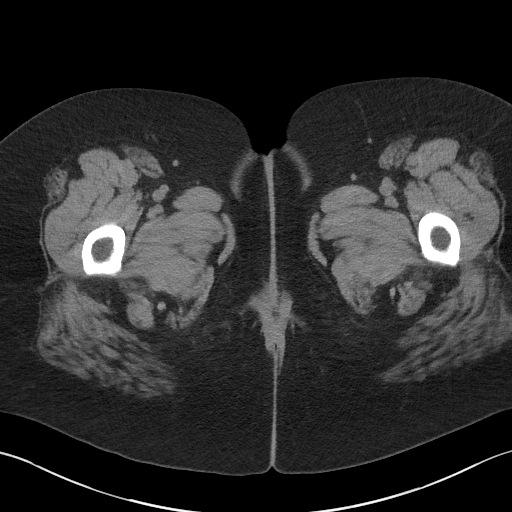
[im 5/107  bone]
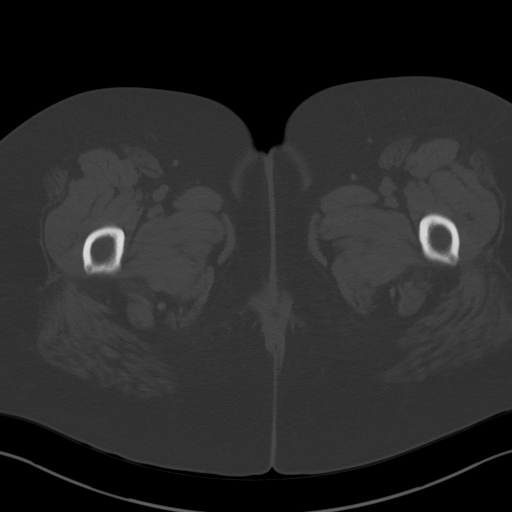
[im 13/107  soft-tissue]
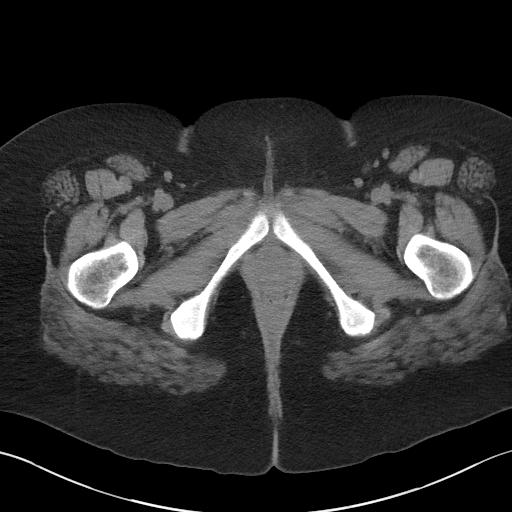
[im 22/107  soft-tissue]
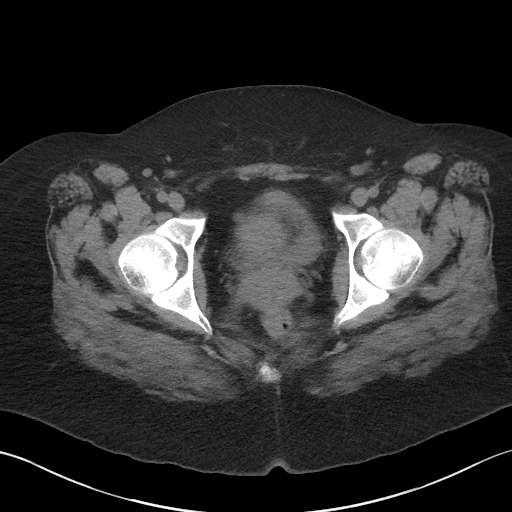
[im 30/107  soft-tissue]
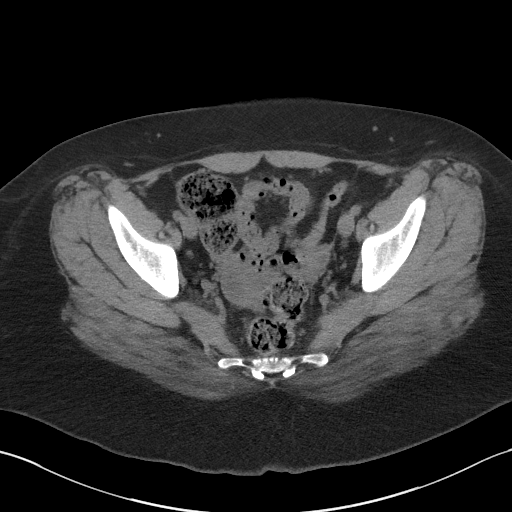
[im 39/107  soft-tissue]
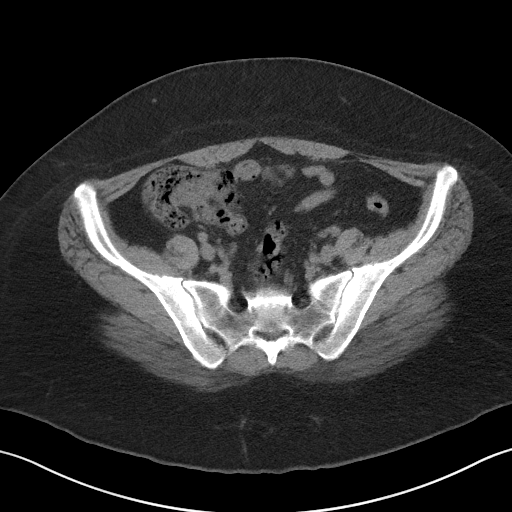
[im 47/107  soft-tissue]
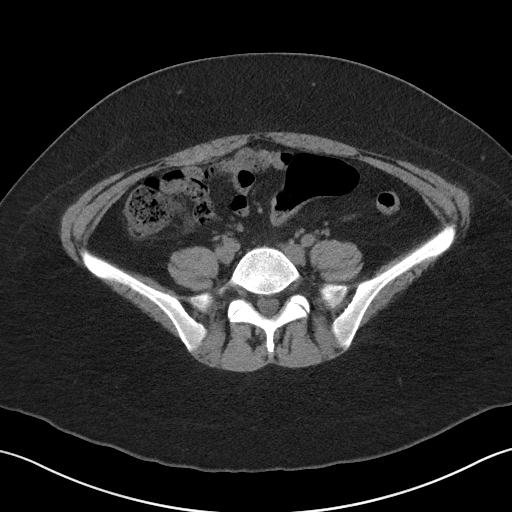
[im 56/107  soft-tissue]
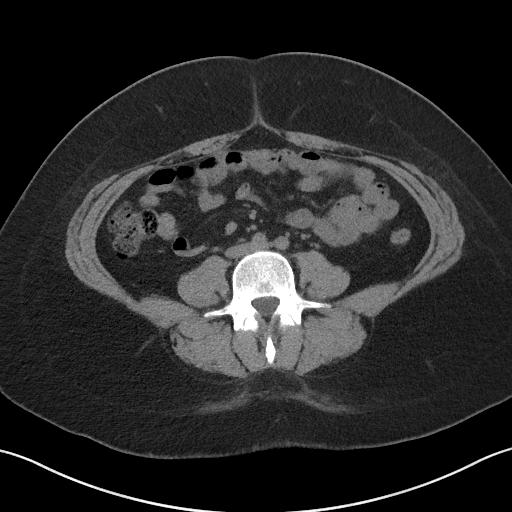
[im 60/107  soft-tissue]
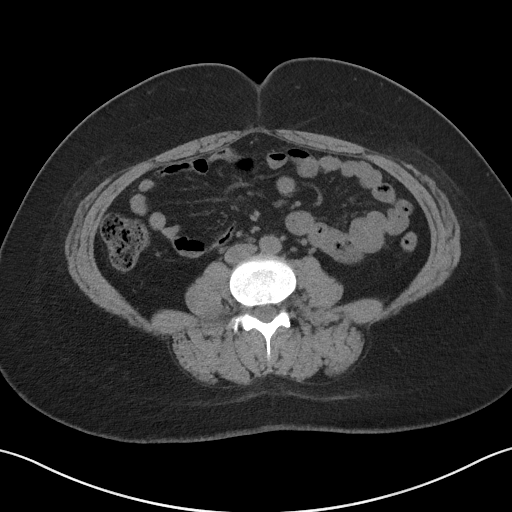
[im 68/107  soft-tissue]
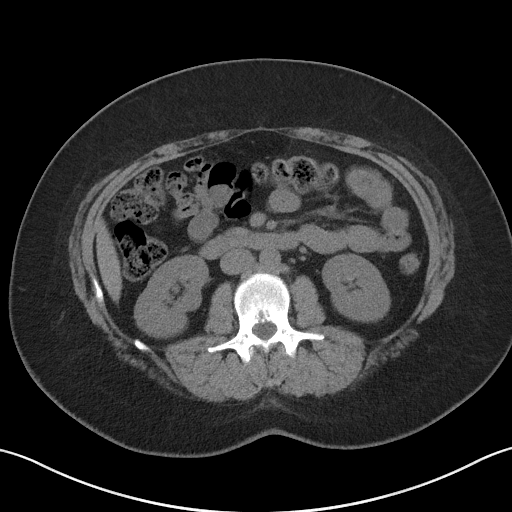
[im 68/107  bone]
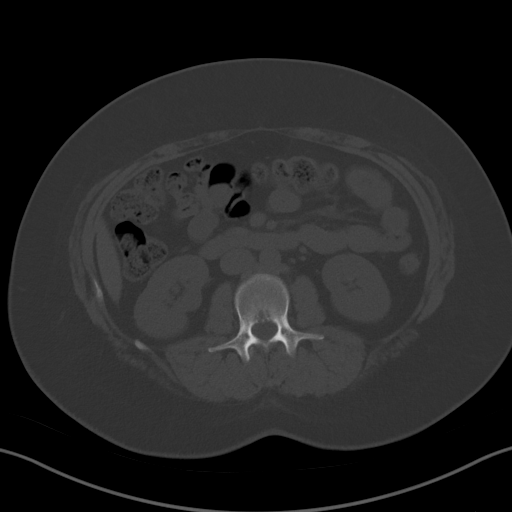
[im 77/107  soft-tissue]
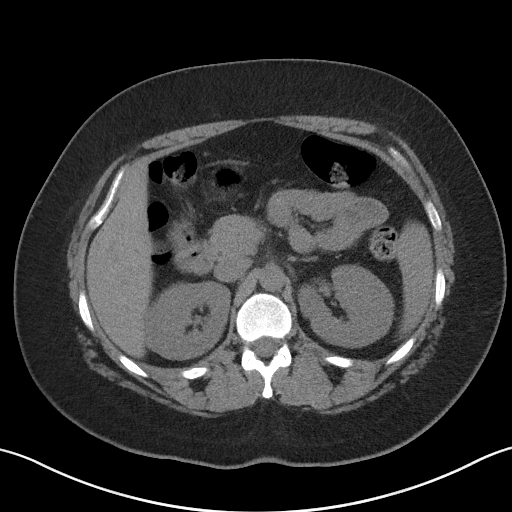
[im 85/107  soft-tissue]
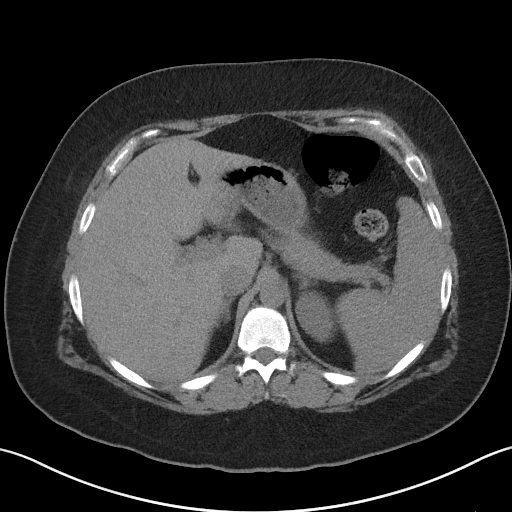
[im 94/107  soft-tissue]
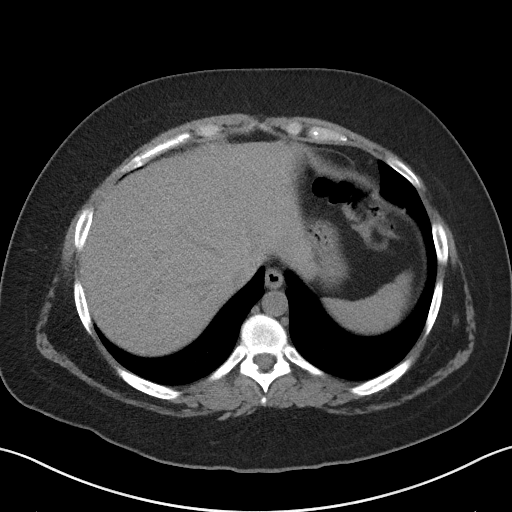
[im 102/107  soft-tissue]
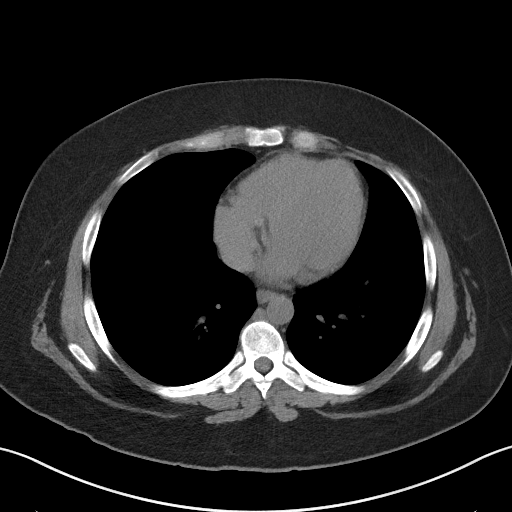

[Series 5: coronal · coronal · 0.85mm/px · 3 of 158 slices shown]
[im 53/158  soft-tissue]
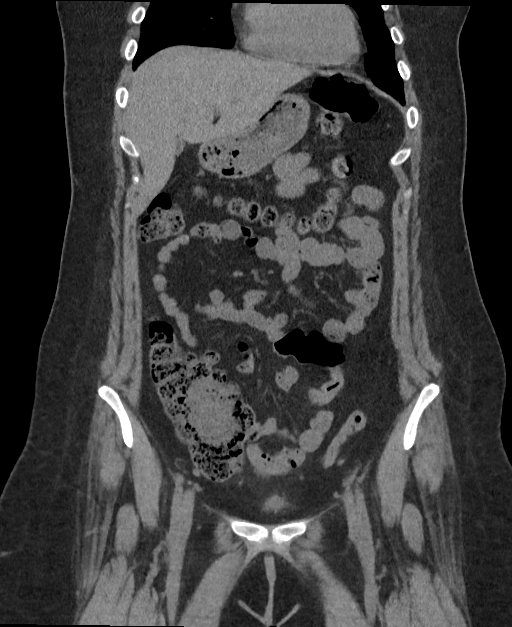
[im 70/158  soft-tissue]
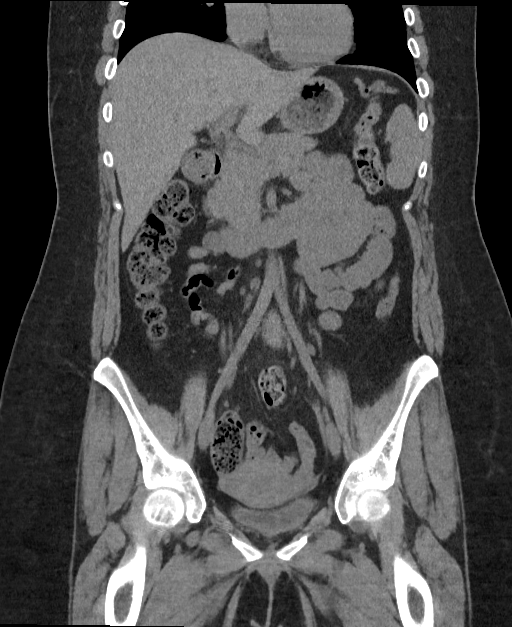
[im 88/158  soft-tissue]
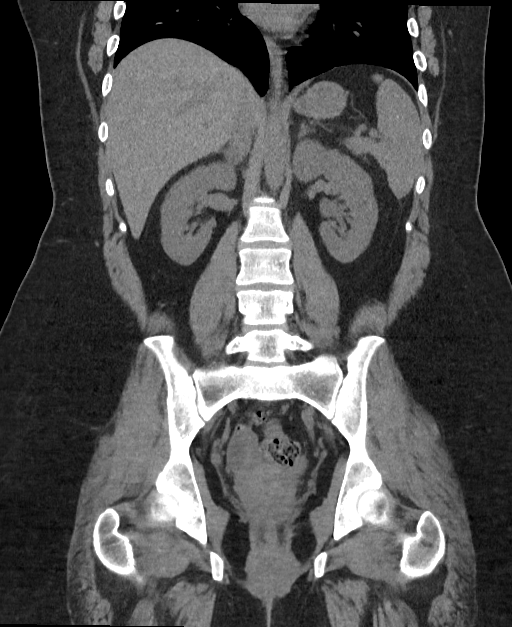

[16 of 46 positions shown; findings below may reference images not displayed]

FINDINGS: Evaluation is limited secondary to lack of IV contrast.

Lower chest: No acute abnormality.

Hepatobiliary: Unremarkable noncontrast appearance of the liver and
gallbladder.

Pancreas: No peripancreatic fat stranding.

Spleen: Upper limits of normal in size.

Adrenals/Urinary Tract: Adrenal glands are unremarkable. No
hydronephrosis. No nephrolithiasis. Bladder is completely
decompressed, limiting evaluation.

Stomach/Bowel: Stomach is within normal limits. Appendix appears
normal. No evidence of bowel wall thickening, distention, or
inflammatory changes.

Vascular/Lymphatic: No significant vascular findings are present. No
enlarged abdominal or pelvic lymph nodes.

Reproductive: Uterus and bilateral adnexa are unremarkable.

Other: No abdominal wall hernia or abnormality. No abdominopelvic
ascites.

Musculoskeletal: No acute or significant osseous findings.
IMPRESSION: No CT etiology for hematuria identified.

## 2022-05-16 IMAGING — US US PELVIS COMPLETE TRANSABD/TRANSVAG W DUPLEX
1 series · 13 of 25 positions shown · non-contrast
Comparison: 11/09/2020

CLINICAL DATA: Vaginal bleeding and lower abdominal pain

EXAM:
TRANSABDOMINAL AND TRANSVAGINAL ULTRASOUND OF PELVIS
DOPPLER ULTRASOUND OF OVARIES
TECHNIQUE: Both transabdominal and transvaginal ultrasound examinations of the
pelvis were performed. Transabdominal technique was performed for
global imaging of the pelvis including uterus, ovaries, adnexal
regions, and pelvic cul-de-sac.
It was necessary to proceed with endovaginal exam following the
transabdominal exam to visualize the ovaries. Color and duplex
Doppler ultrasound was utilized to evaluate blood flow to the
ovaries.

[Series 1: us pelvic complete w transvaginal and torsion righ · 13 of 103 slices shown]
[im 1/103]
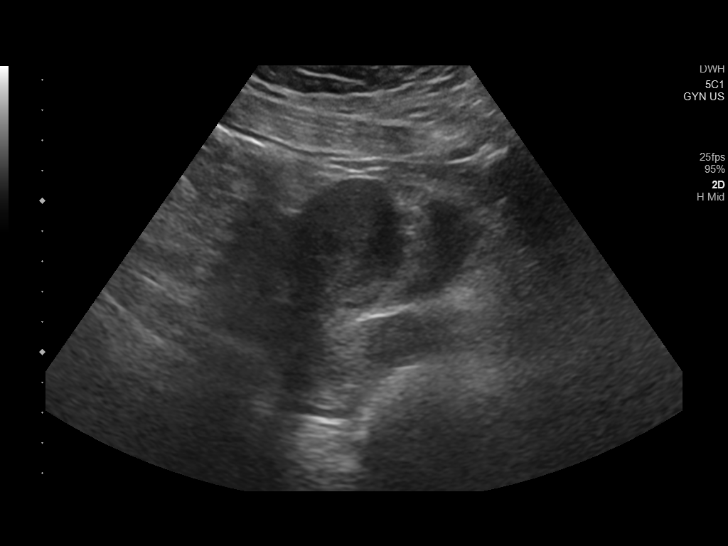
[im 9/103]
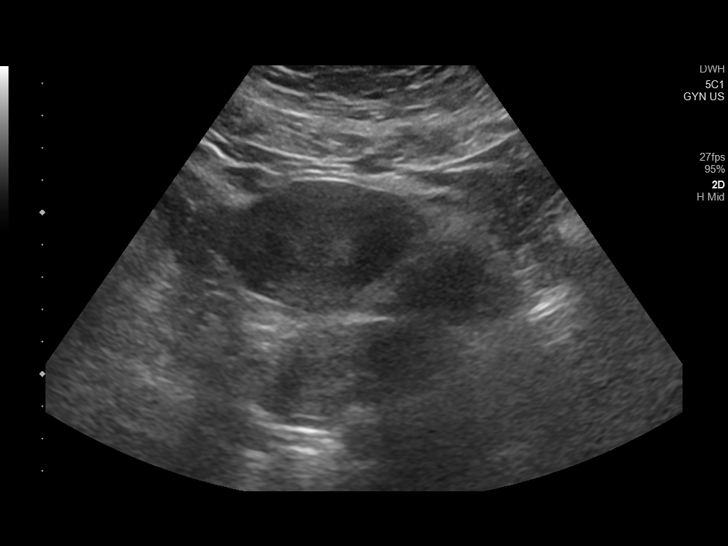
[im 18/103]
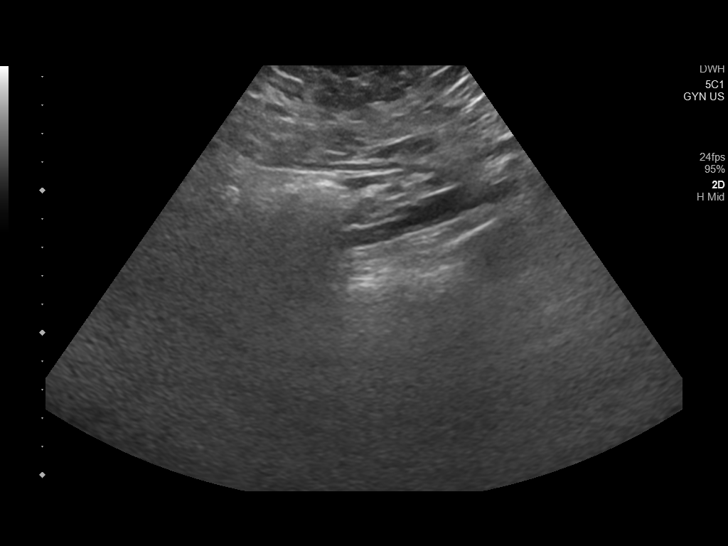
[im 26/103]
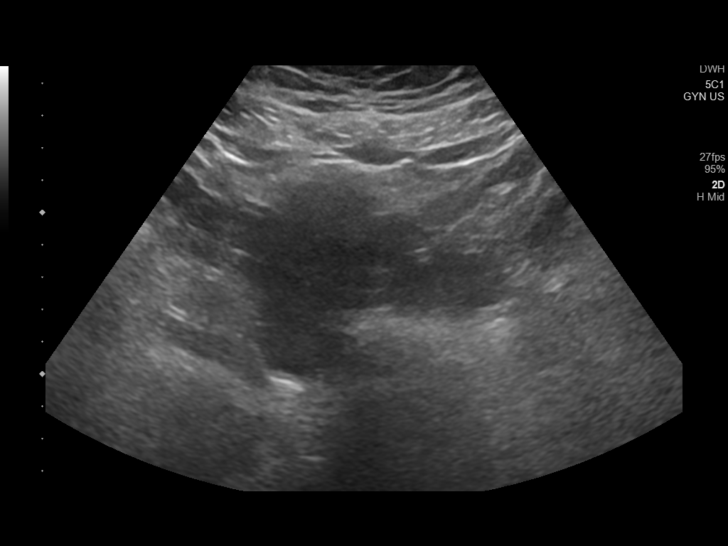
[im 35/103]
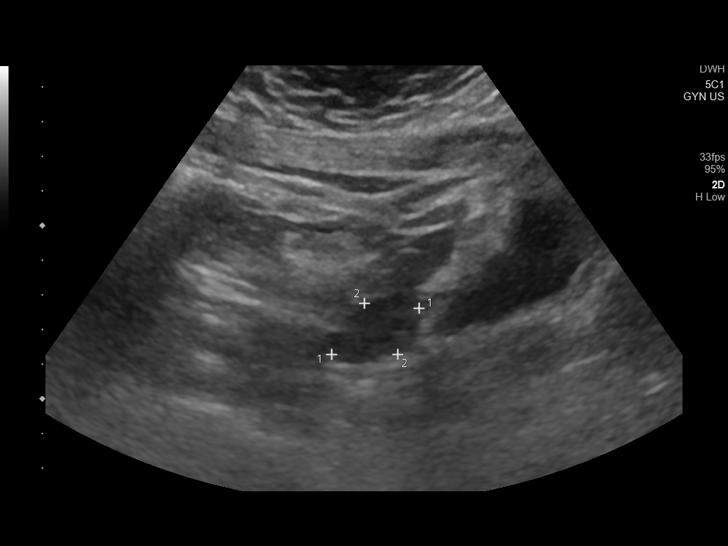
[im 43/103]
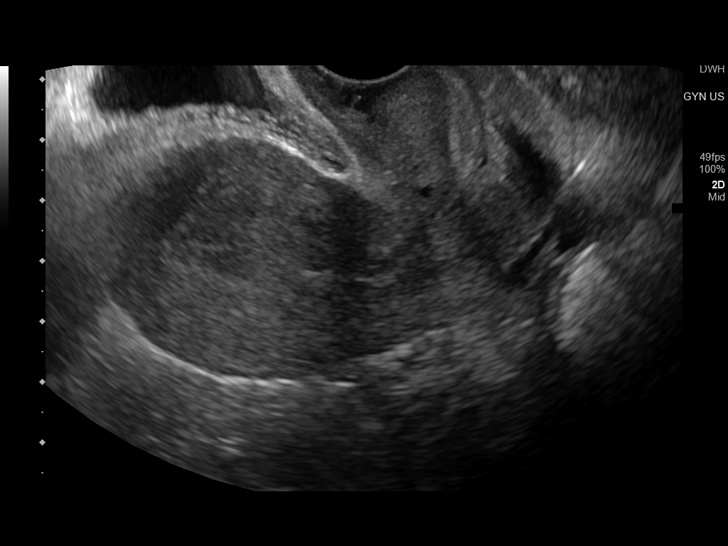
[im 52/103]
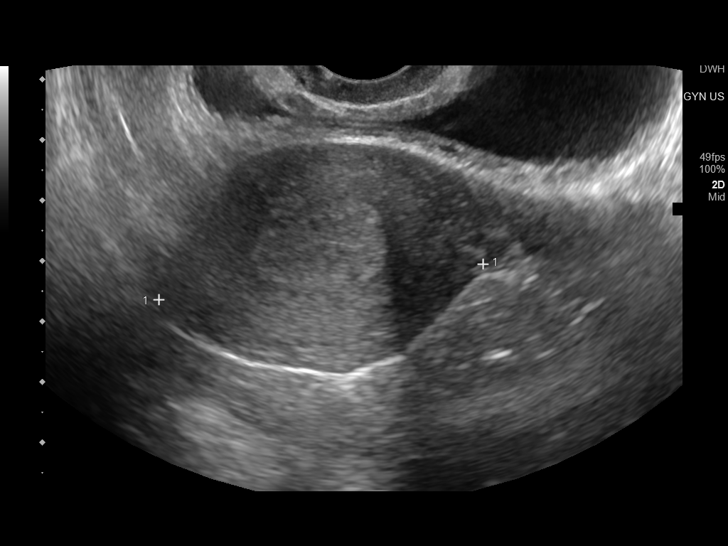
[im 60/103]
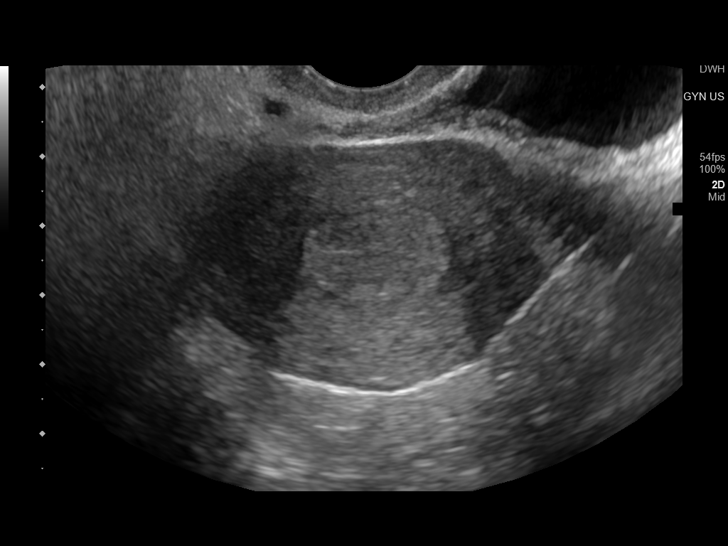
[im 69/103]
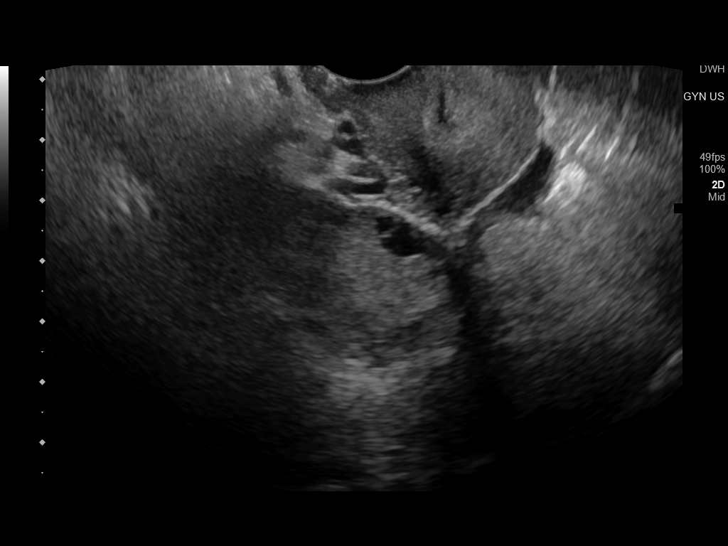
[im 77/103]
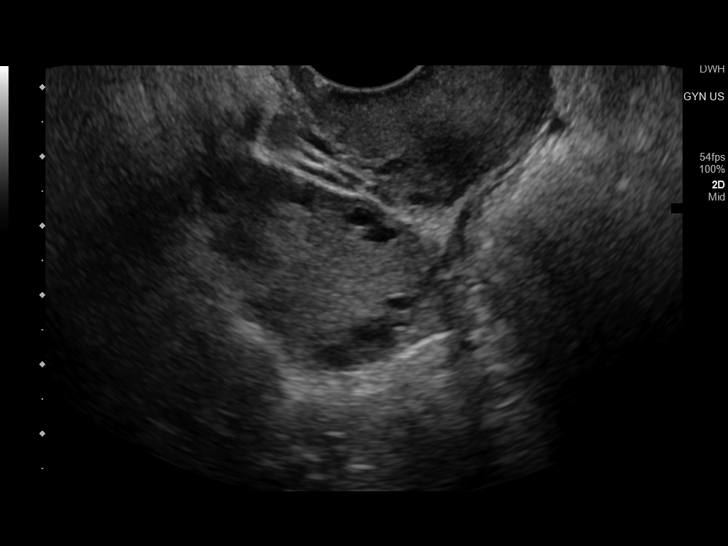
[im 86/103]
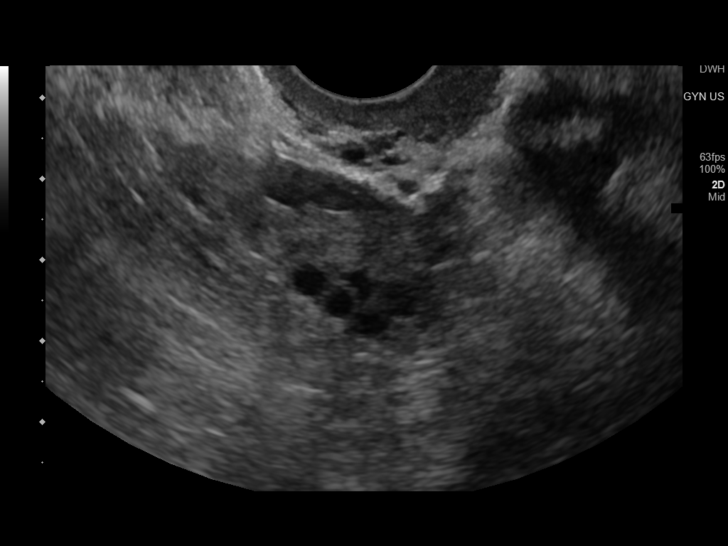
[im 94/103]
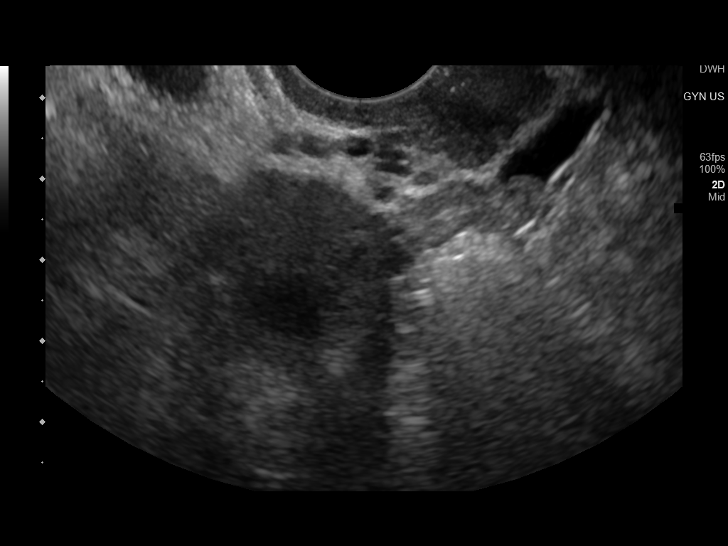
[im 103/103]
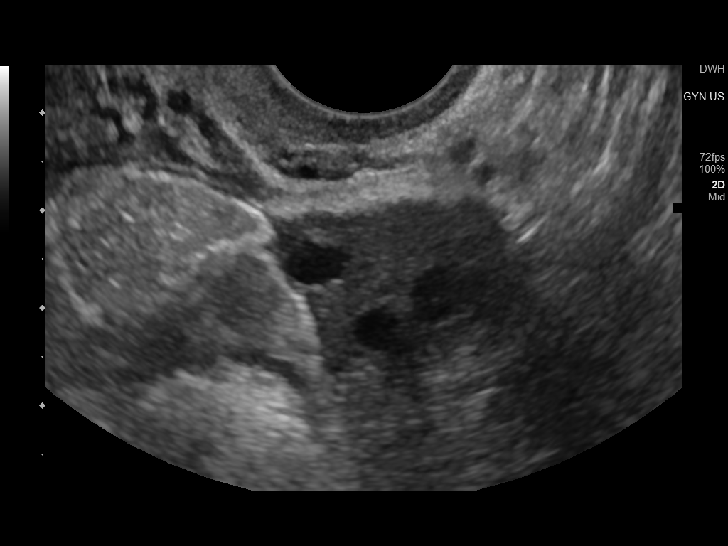

[13 of 25 positions shown; findings below may reference images not displayed]

FINDINGS: Uterus

Measurements: 7 x 4 x 5 cm = volume: 80 mL. No fibroids or other
mass visualized.

Endometrium

Thickness: 12mm.  No focal abnormality visualized.

Right ovary

Measurements: 38 x 25 x 24 mm = volume: 12 mL. Normal appearance/no
adnexal mass.

Left ovary

Measurements: 31 x 21 x 24 mm = volume: 8 mL. Normal appearance/no
adnexal mass.

Pulsed Doppler evaluation of both ovaries demonstrates normal
low-resistance arterial and venous waveforms.

Other findings

No abnormal free fluid.
IMPRESSION: Normal pelvic ultrasound.

## 2022-07-28 IMAGING — US US OB < 14 WEEKS - US OB TV
1 series · 14 of 28 positions shown · non-contrast
Comparison: None.

CLINICAL DATA: Vaginal bleeding in pregnancy, beta HCG 145,952

EXAM:
OBSTETRIC <14 WK ULTRASOUND
TECHNIQUE: Transabdominal ultrasound was performed for evaluation of the
gestation as well as the maternal uterus and adnexal regions.

[Series 1: us ob less than 14 weeks with ob transvaginal · 14 of 108 slices shown]
[im 4/108]
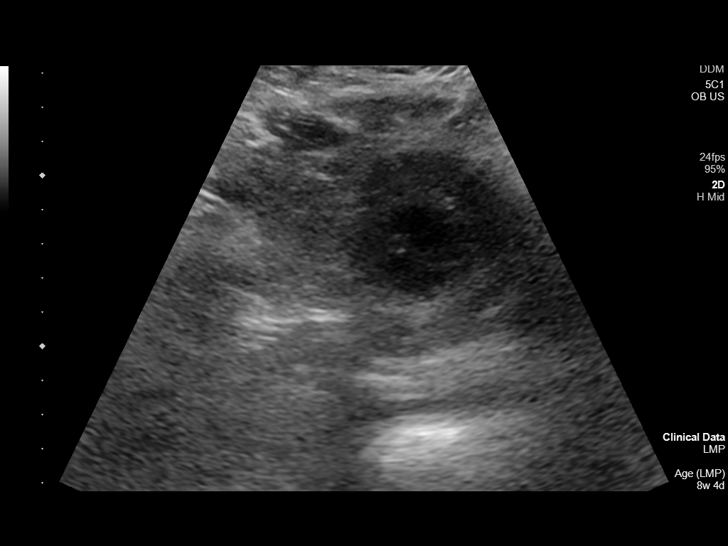
[im 12/108]
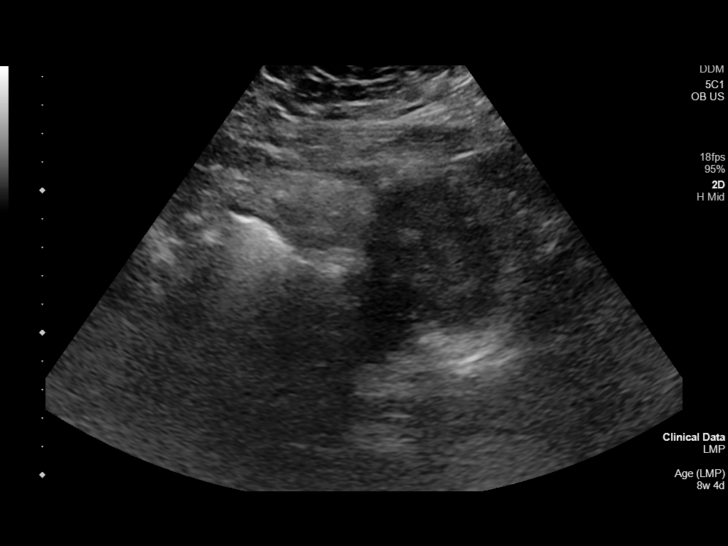
[im 20/108]
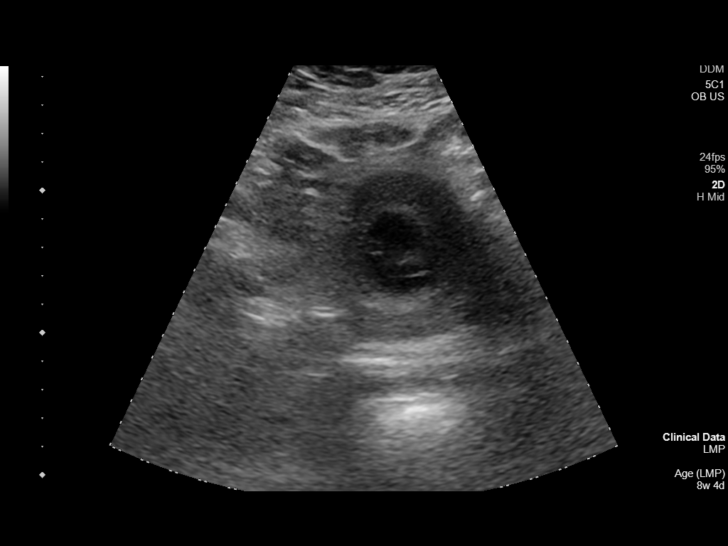
[im 28/108]
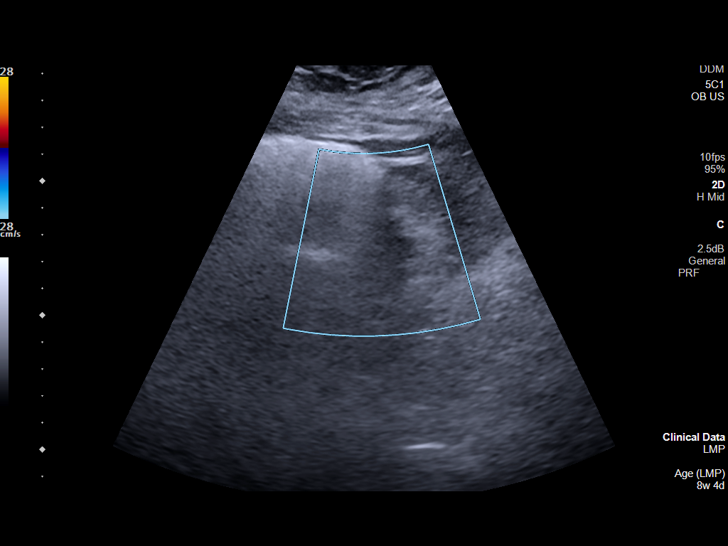
[im 36/108]
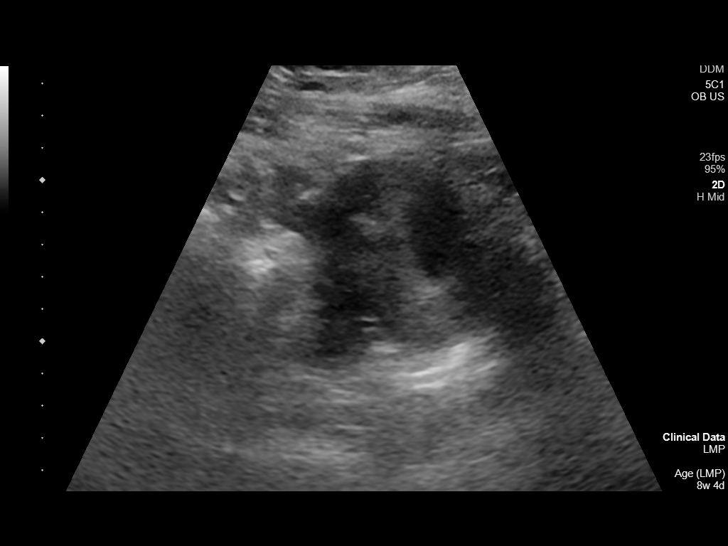
[im 44/108]
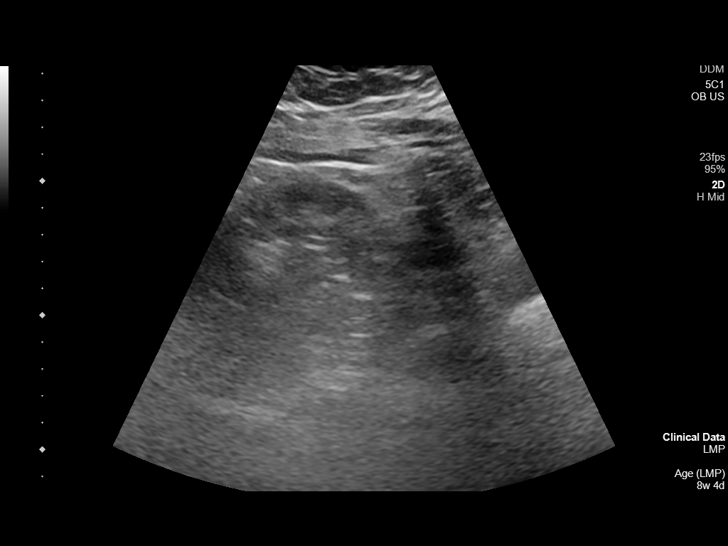
[im 52/108]
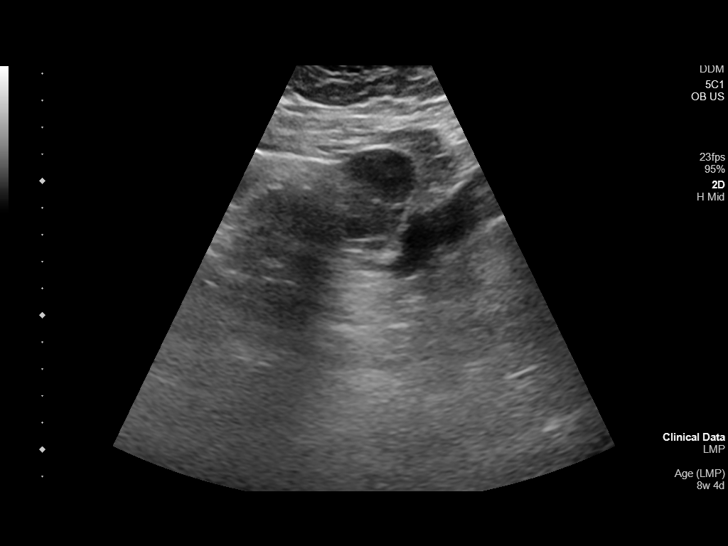
[im 60/108]
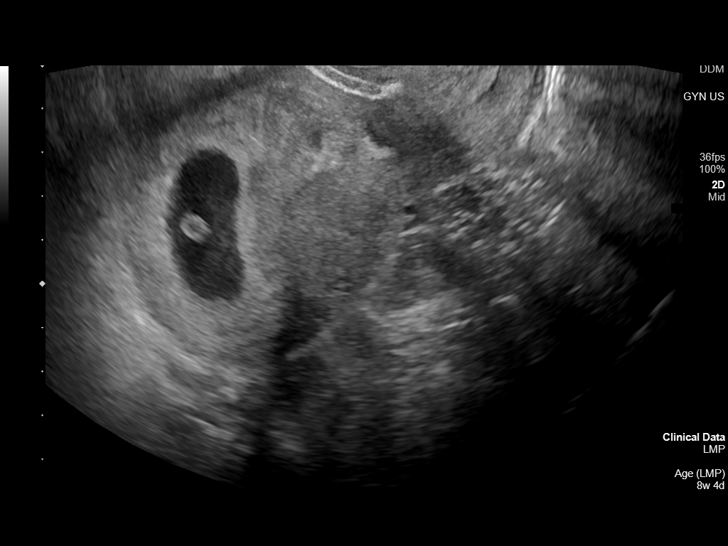
[im 68/108]
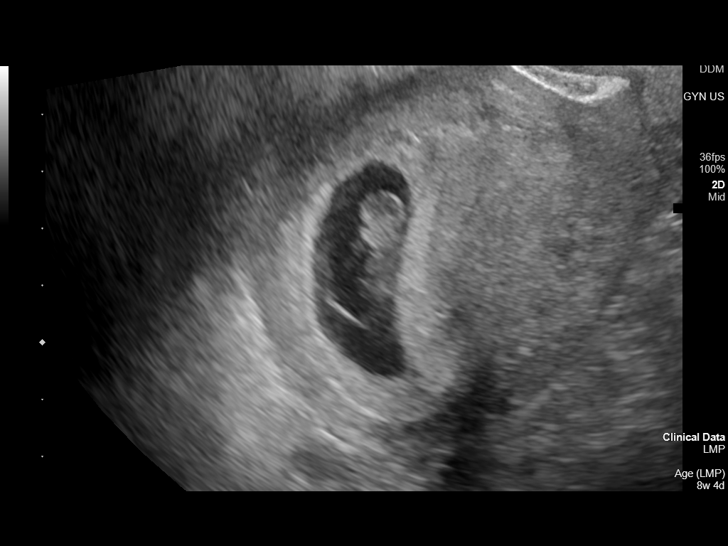
[im 76/108]
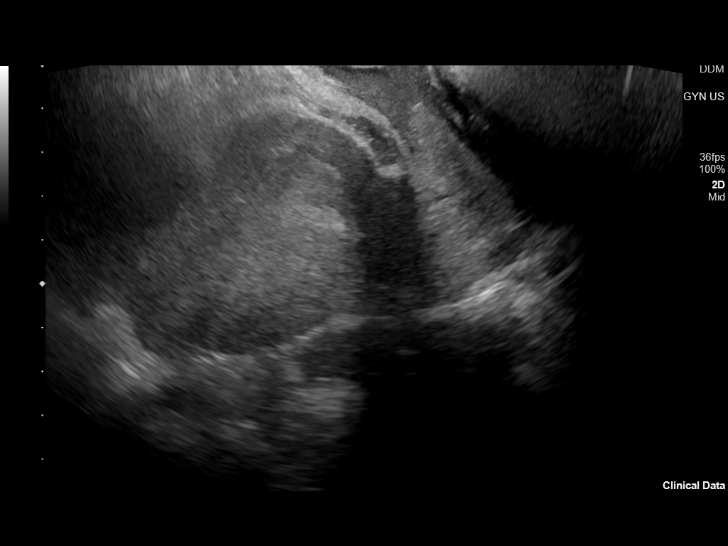
[im 84/108]
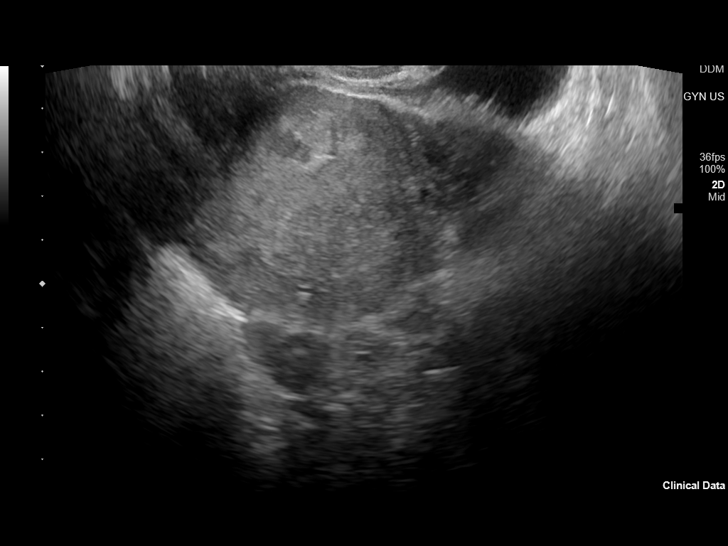
[im 92/108]
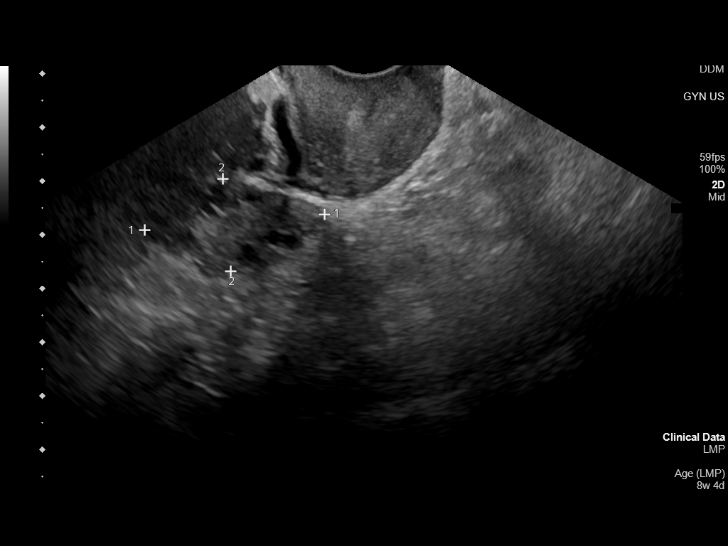
[im 100/108]
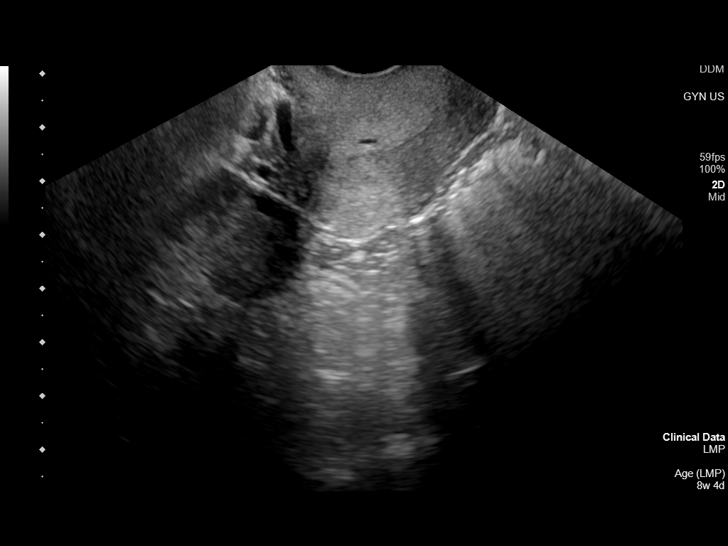
[im 108/108]
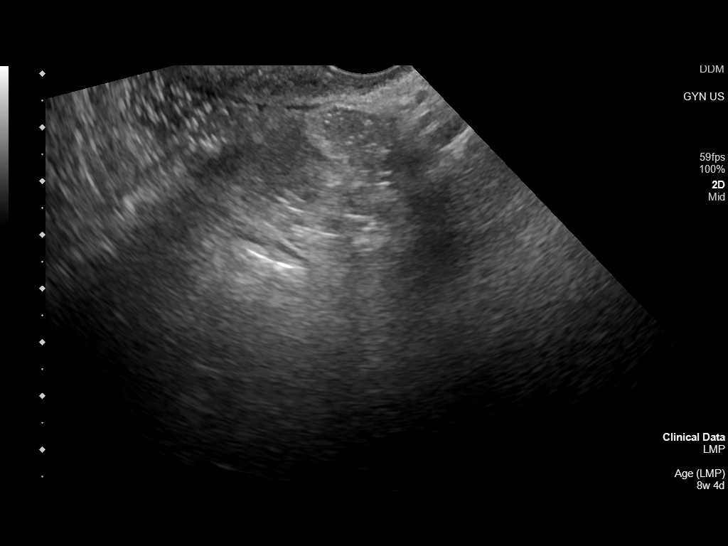

[14 of 28 positions shown; findings below may reference images not displayed]

FINDINGS: Intrauterine gestational sac: Single

Yolk sac:  Visualized.

Embryo:  Visualized.

Cardiac Activity: Visualized.

Heart Rate: 160 bpm

CRL: 19.5 mm   8 w 4 d                  US EDC: 05/29/2021

Subchorionic hemorrhage:  None visualized.

Maternal uterus/adnexae: Within normal limits. No pelvic free fluid.
IMPRESSION: Single viable intrauterine pregnancy, further described.
